# Patient Record
Sex: Female | Born: 1970 | Race: White | Hispanic: No | Smoking: Current every day smoker
Health system: Southern US, Community
[De-identification: ages and names within clinical notes are randomized; demographics above are authoritative.]

## PROBLEM LIST (undated history)

## (undated) DIAGNOSIS — E669 Obesity, unspecified: Secondary | ICD-10-CM

## (undated) DIAGNOSIS — G4733 Obstructive sleep apnea (adult) (pediatric): Secondary | ICD-10-CM

## (undated) DIAGNOSIS — M549 Dorsalgia, unspecified: Secondary | ICD-10-CM

## (undated) DIAGNOSIS — IMO0002 Reserved for concepts with insufficient information to code with codable children: Secondary | ICD-10-CM

## (undated) DIAGNOSIS — E7800 Pure hypercholesterolemia, unspecified: Secondary | ICD-10-CM

## (undated) DIAGNOSIS — C220 Liver cell carcinoma: Secondary | ICD-10-CM

## (undated) DIAGNOSIS — I10 Essential (primary) hypertension: Secondary | ICD-10-CM

## (undated) DIAGNOSIS — M1990 Unspecified osteoarthritis, unspecified site: Secondary | ICD-10-CM

## (undated) DIAGNOSIS — E119 Type 2 diabetes mellitus without complications: Secondary | ICD-10-CM

## (undated) DIAGNOSIS — G8929 Other chronic pain: Secondary | ICD-10-CM

## (undated) HISTORY — PX: TUBAL LIGATION: SHX77

## (undated) HISTORY — PX: CARPAL TUNNEL RELEASE: SHX101

## (undated) HISTORY — PX: CHOLECYSTECTOMY: SHX55

---

## 2001-04-26 ENCOUNTER — Emergency Department (HOSPITAL_COMMUNITY): Admission: EM | Admit: 2001-04-26 | Discharge: 2001-04-26 | Payer: Self-pay | Admitting: Emergency Medicine

## 2004-06-13 ENCOUNTER — Ambulatory Visit (HOSPITAL_COMMUNITY): Admission: RE | Admit: 2004-06-13 | Discharge: 2004-06-13 | Payer: Self-pay | Admitting: Internal Medicine

## 2010-04-22 ENCOUNTER — Encounter (INDEPENDENT_AMBULATORY_CARE_PROVIDER_SITE_OTHER): Payer: Self-pay | Admitting: *Deleted

## 2010-11-21 ENCOUNTER — Ambulatory Visit (HOSPITAL_COMMUNITY): Admission: RE | Admit: 2010-11-21 | Discharge: 2010-11-21 | Payer: Self-pay | Admitting: Family Medicine

## 2011-01-14 ENCOUNTER — Encounter: Payer: Self-pay | Admitting: Family Medicine

## 2011-01-24 NOTE — Letter (Signed)
Summary: New Patient letter  [facility]  [address] [place], [place] [address]   Phone: [phone]  Fax: [phone]       04/22/2010 MRN: [id number]  [facility] [address] [place], [place]  [address]  Dear Ms. Gramajo,  Welcome to the Gastroenterology Division at [facility]    You are scheduled to see Dr. Arlyce Dice on 05/11/2010 at 10:45am on the 3rd floor at [facility], [address]  We ask that you try to arrive at our office 15 minutes prior to your appointment time to allow for check-in.  We would like you to complete the enclosed self-administered evaluation form prior to your visit and bring it with you on the day of your appointment.  We will review it with you.  Also, please bring a complete list of all your medications or, if you prefer, bring the medication bottles and we will list them.  Please bring your insurance card so that we may make a copy of it.  If your insurance requires a referral to see a specialist, please bring your referral form from your primary care physician.  Co-payments are due at the time of your visit and may be paid by cash, check or credit card.     Your office visit will consist of a consult with your physician (includes a physical exam), any laboratory testing he/she may order, scheduling of any necessary diagnostic testing (e.g. x-ray, ultrasound, CT-scan), and scheduling of a procedure (e.g. Endoscopy, Colonoscopy) if required.  Please allow enough time on your schedule to allow for any/all of these possibilities.    If you cannot keep your appointment, please call [phone] to cancel or reschedule prior to your appointment date.  This allows [place] the opportunity to schedule an appointment for another patient in need of care.  If you do not cancel or reschedule by 5 p.m. the business day prior to your appointment date, you will be charged a $50.00 late cancellation/no-show fee.    Thank you for choosing [facility] for your medical needs.  We appreciate the opportunity to care for you.  Please visit [place] at our website  to learn more about our practice.                     Sincerely,                                                             The Gastroenterology Division

## 2012-06-21 IMAGING — US US TRANSVAGINAL NON-OB
1 series · 14 of 25 positions shown · non-contrast
Comparison: CT abdomen and pelvis 06/13/2004.

CLINICAL DATA: Pelvic pain.



[Series 1: us transvaginal non-ob · 0.30mm/px · 14 of 62 slices shown]
[im 1/62]
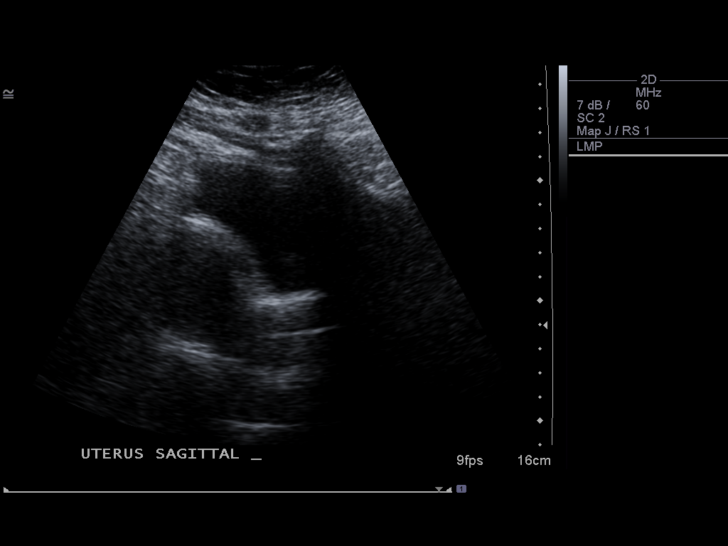
[im 6/62]
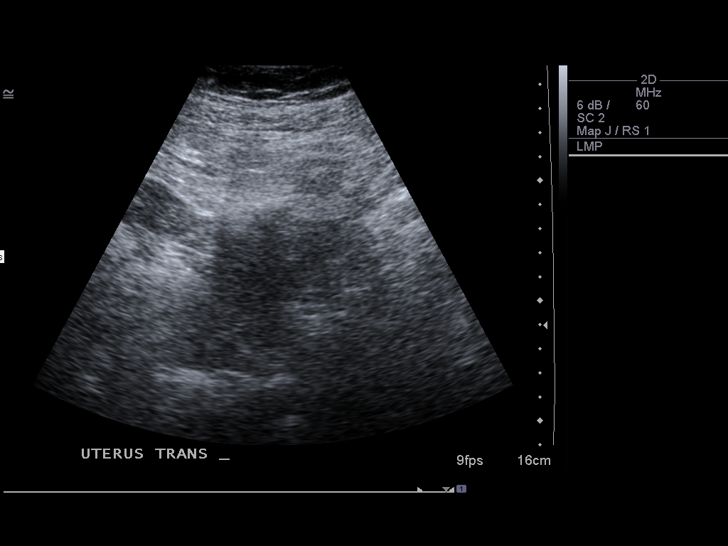
[im 11/62]
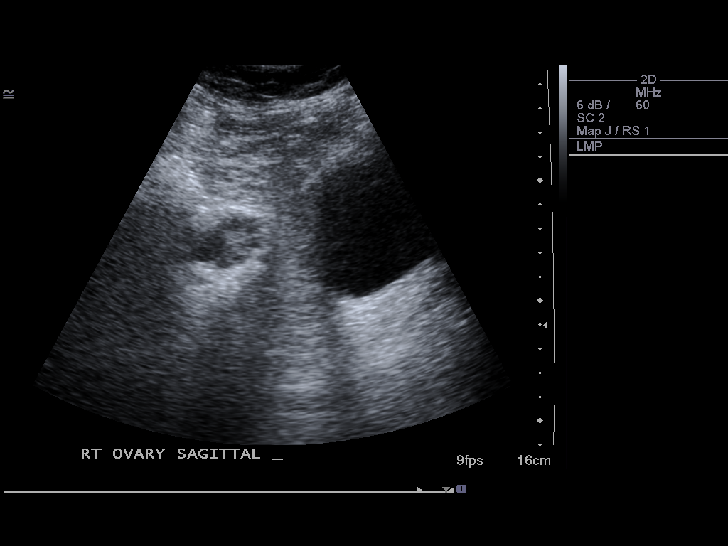
[im 16/62]
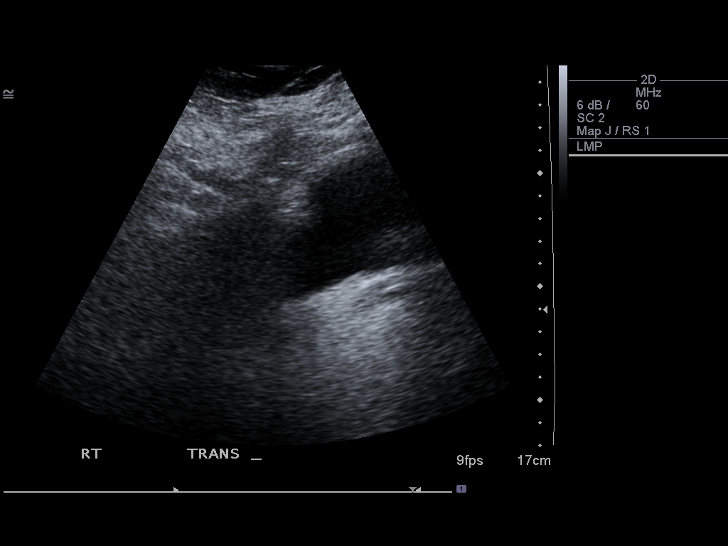
[im 21/62]
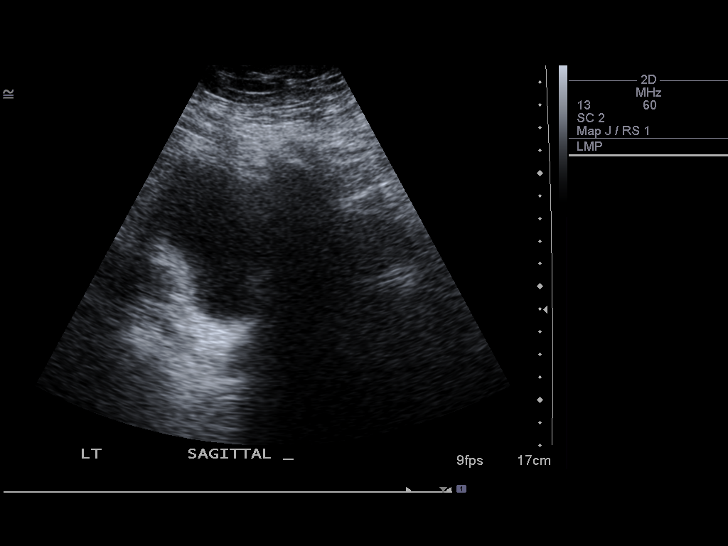
[im 23/62]
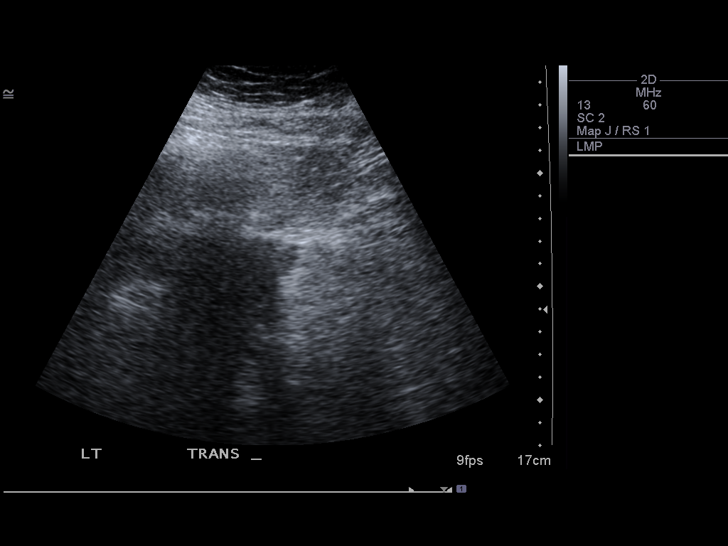
[im 28/62]
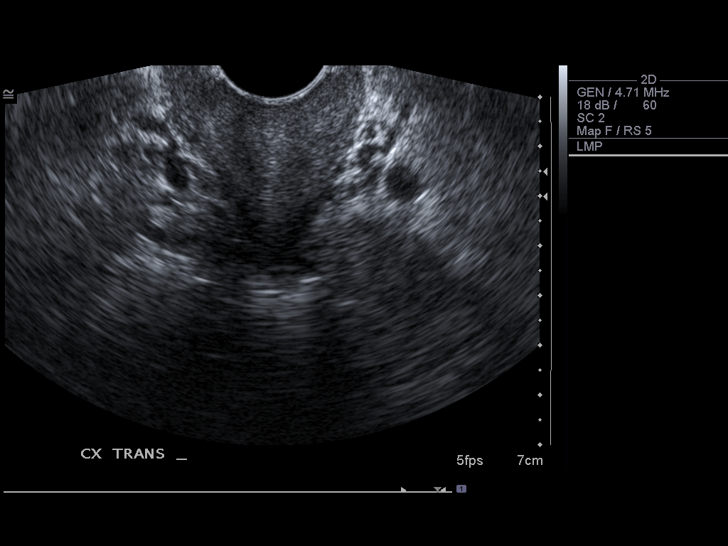
[im 34/62]
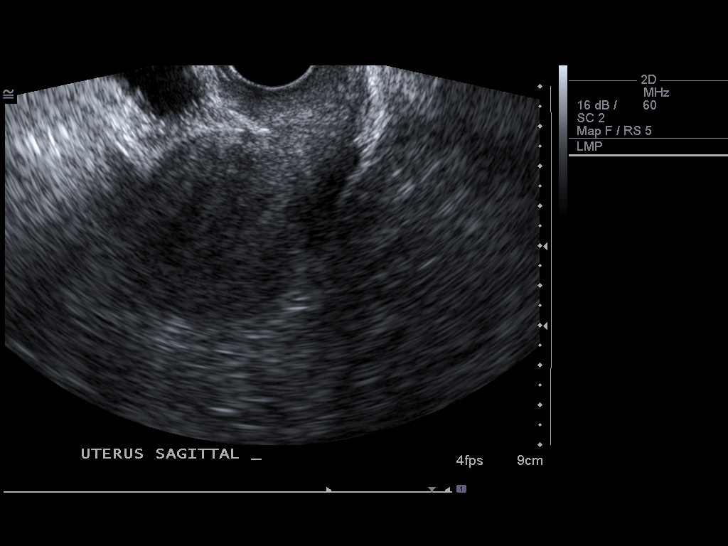
[im 39/62]
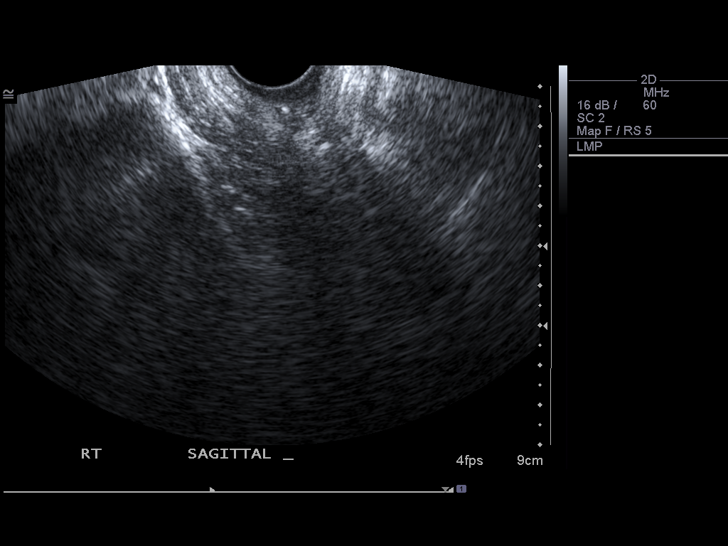
[im 41/62]
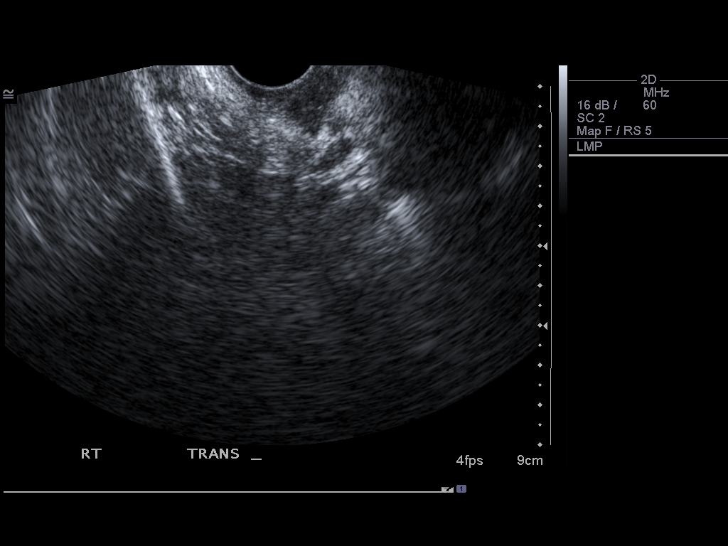
[im 46/62]
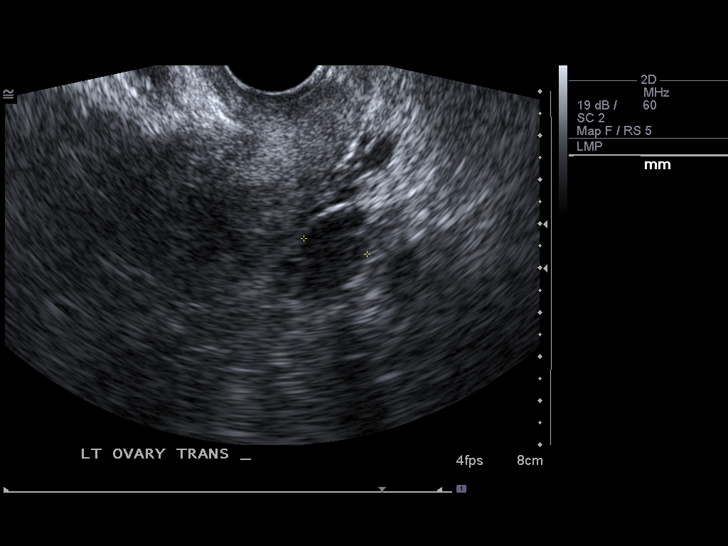
[im 51/62]
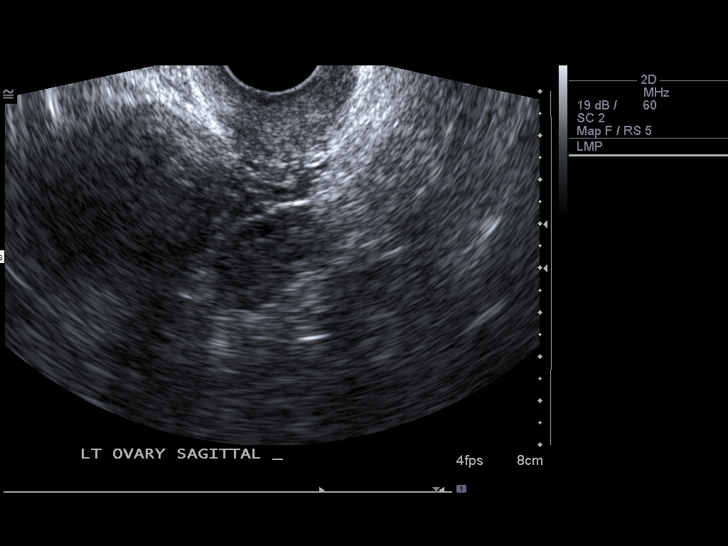
[im 56/62]
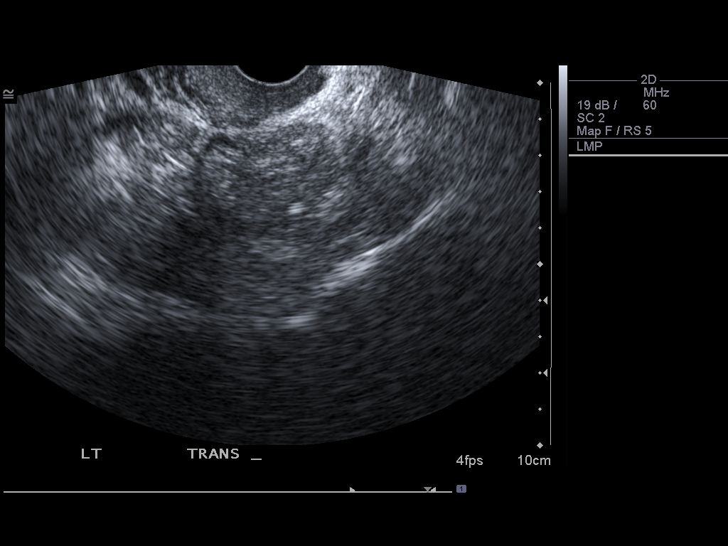
[im 62/62]
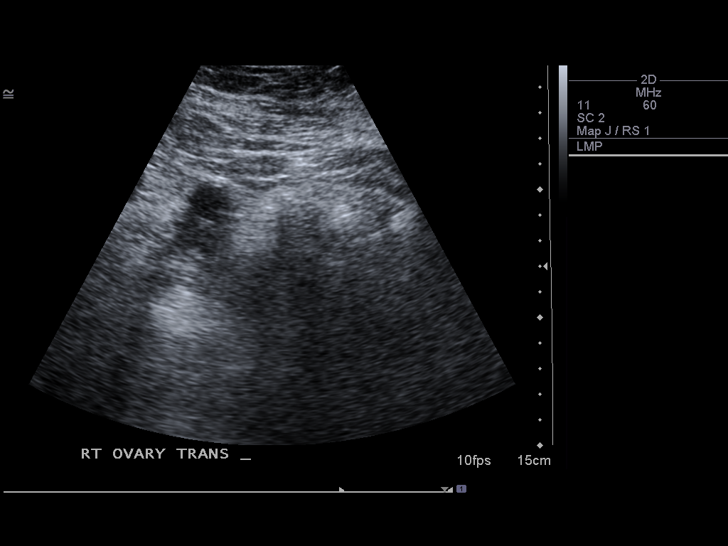

[14 of 25 positions shown; findings below may reference images not displayed]

FINDINGS: Uterus measures 8.5 x 5.1 x 5.1 cm.  It is of normal size and
echotexture.  No focal lesions are evident.

Endometrium measures 1.0 cm, within normal limits.

Right Ovary measures 4.0 x 2.2 x 3.6 cm.  It is of normal size and
echotexture.  A dominant follicle measures 1.4 cm, within normal
limits.

Left Ovary measures 3.2 x 1.7 x 1.5 cm.  It is of normal size and
echotexture.

Other Findings:  No free fluid is present.
IMPRESSION: Negative pelvic ultrasound.

## 2013-08-17 ENCOUNTER — Other Ambulatory Visit: Payer: Self-pay | Admitting: Nurse Practitioner

## 2014-02-07 ENCOUNTER — Emergency Department (HOSPITAL_COMMUNITY)
Admission: EM | Admit: 2014-02-07 | Discharge: 2014-02-07 | Disposition: A | Discharge status: Home / Self Care | Payer: BC Managed Care – PPO | Attending: Emergency Medicine | Admitting: Emergency Medicine

## 2014-02-07 ENCOUNTER — Encounter (HOSPITAL_COMMUNITY): Payer: Self-pay | Admitting: Emergency Medicine

## 2014-02-07 DIAGNOSIS — E7800 Pure hypercholesterolemia, unspecified: Secondary | ICD-10-CM | POA: Insufficient documentation

## 2014-02-07 DIAGNOSIS — R519 Headache, unspecified: Secondary | ICD-10-CM

## 2014-02-07 DIAGNOSIS — Z880 Allergy status to penicillin: Secondary | ICD-10-CM | POA: Insufficient documentation

## 2014-02-07 DIAGNOSIS — I10 Essential (primary) hypertension: Secondary | ICD-10-CM | POA: Insufficient documentation

## 2014-02-07 DIAGNOSIS — IMO0002 Reserved for concepts with insufficient information to code with codable children: Secondary | ICD-10-CM | POA: Insufficient documentation

## 2014-02-07 DIAGNOSIS — Z79899 Other long term (current) drug therapy: Secondary | ICD-10-CM | POA: Insufficient documentation

## 2014-02-07 DIAGNOSIS — F17200 Nicotine dependence, unspecified, uncomplicated: Secondary | ICD-10-CM | POA: Insufficient documentation

## 2014-02-07 DIAGNOSIS — M129 Arthropathy, unspecified: Secondary | ICD-10-CM | POA: Insufficient documentation

## 2014-02-07 DIAGNOSIS — R51 Headache: Secondary | ICD-10-CM | POA: Insufficient documentation

## 2014-02-07 DIAGNOSIS — G8929 Other chronic pain: Secondary | ICD-10-CM | POA: Insufficient documentation

## 2014-02-07 DIAGNOSIS — E119 Type 2 diabetes mellitus without complications: Secondary | ICD-10-CM | POA: Insufficient documentation

## 2014-02-07 DIAGNOSIS — R112 Nausea with vomiting, unspecified: Secondary | ICD-10-CM | POA: Insufficient documentation

## 2014-02-07 HISTORY — DX: Unspecified osteoarthritis, unspecified site: M19.90

## 2014-02-07 HISTORY — DX: Type 2 diabetes mellitus without complications: E11.9

## 2014-02-07 HISTORY — DX: Essential (primary) hypertension: I10

## 2014-02-07 HISTORY — DX: Pure hypercholesterolemia, unspecified: E78.00

## 2014-02-07 HISTORY — DX: Obstructive sleep apnea (adult) (pediatric): G47.33

## 2014-02-07 HISTORY — DX: Dorsalgia, unspecified: M54.9

## 2014-02-07 HISTORY — DX: Other chronic pain: G89.29

## 2014-02-07 HISTORY — DX: Reserved for concepts with insufficient information to code with codable children: IMO0002

## 2014-02-07 MED ORDER — METOCLOPRAMIDE HCL 5 MG/ML IJ SOLN
10.0000 mg | Freq: Once | INTRAMUSCULAR | Status: DC
  Filled 2014-02-07: qty 2

## 2014-02-07 MED ORDER — DIPHENHYDRAMINE HCL 50 MG/ML IJ SOLN
25.0000 mg | Freq: Once | INTRAMUSCULAR | Status: AC
  Administered 2014-02-07: 25 mg via INTRAVENOUS
  Filled 2014-02-07: qty 1

## 2014-02-07 MED ORDER — KETOROLAC TROMETHAMINE 30 MG/ML IJ SOLN
30.0000 mg | Freq: Once | INTRAMUSCULAR | Status: AC
  Administered 2014-02-07: 30 mg via INTRAVENOUS
  Filled 2014-02-07: qty 1

## 2014-02-07 MED ORDER — PSEUDOEPHEDRINE HCL 60 MG PO TABS: 60.0000 mg | ORAL_TABLET | Freq: Three times a day (TID) | ORAL | Status: DC

## 2014-02-07 MED ORDER — DEXAMETHASONE SODIUM PHOSPHATE 10 MG/ML IJ SOLN
10.0000 mg | Freq: Once | INTRAMUSCULAR | Status: AC
  Administered 2014-02-07: 10 mg via INTRAVENOUS
  Filled 2014-02-07: qty 1

## 2014-02-07 MED ORDER — TRIAMCINOLONE ACETONIDE 55 MCG/ACT NA AERO: 2.0000 | INHALATION_SPRAY | Freq: Every day | NASAL | Status: AC

## 2014-02-07 MED ORDER — METOCLOPRAMIDE HCL 5 MG/ML IJ SOLN
10.0000 mg | Freq: Once | INTRAMUSCULAR | Status: AC
  Administered 2014-02-07: 10 mg via INTRAVENOUS

## 2014-02-07 MED ORDER — SODIUM CHLORIDE 0.9 % IV BOLUS (SEPSIS)
1000.0000 mL | Freq: Once | INTRAVENOUS | Status: AC
  Administered 2014-02-07: 1000 mL via INTRAVENOUS

## 2014-02-07 NOTE — ED Notes (Addendum)
C/o 2 week HA, h/o migraines, "feels different", pinpoints pain to L forehead above L eye, "head feels squeezed", also reports nausea, bilateral facial numbness, generally weak and confused. has app[place] on [place]. Has tried excedrin, advil, topamax, muscle relaxers and goodies. 10/10. Alert, NAD, calm, interactive, speech clear, resps e/u, speaking in clear complete sentences. MAEx4.

## 2014-02-07 NOTE — Discharge Instructions (Signed)
As discussed, it is important that you follow up as soon as possible with your physician for continued management of your condition.  If you headache persists beyond the next few days, please be sure to follow up with our ENT specialist as well.  If you develop any new, or concerning changes in your condition, please return to the emergency department immediately.

## 2014-02-07 NOTE — ED Provider Notes (Signed)
CSN: [id number]     Arrival date & time 02/07/14  2030 History   First MD Initiated Contact with Patient 02/07/14 2106     Chief Complaint  Patient presents with  . Headache     (Consider location/radiation/quality/duration/timing/severity/associated sxs/prior Treatment) HPI Patient presents with headache.  This headache has been present for almost 2 weeks, his left facial, left temporal, nonradiating.  It is sore, severe, not improved with all medications, including Topamax, OTC medication. There is no new visual loss, though there is photophobia. Patient has a long history of migraines, though today's presentation is somewhat atypical. There is no new neck pain, stiffness, fever. There is associated nausea and vomiting, this is not unusual for the patient with headaches. No no unilateral weakness, chest pain, dyspnea, abdominal pain.  Past Medical History  Diagnosis Date  . Back pain, chronic   . HTN (hypertension)   . Diabetes mellitus without complication   . OSA (obstructive sleep apnea)   . Hypercholesterolemia   . Arthritis   . DDD (degenerative disc disease)    Past Surgical History  Procedure Laterality Date  . Cholecystectomy    . Tubal ligation    . Carpal tunnel release     No family history on file. History  Substance Use Topics  . Smoking status: Current Every Day Smoker  . Smokeless tobacco: Not on file  . Alcohol Use: No   OB History   Grav Para Term Preterm Abortions TAB SAB Ect Mult Living                 Review of Systems  Constitutional:       Per HPI, otherwise negative  HENT:       Per HPI, otherwise negative  Respiratory:       Per HPI, otherwise negative  Cardiovascular:       Per HPI, otherwise negative  Gastrointestinal: Positive for nausea and vomiting. Negative for diarrhea.  Endocrine:       Negative aside from HPI  Genitourinary:       Neg aside from HPI   Musculoskeletal:       Per HPI, otherwise negative  Skin: Negative.    Neurological: Positive for headaches. Negative for syncope.      Allergies  Penicillins  Home Medications   Current Outpatient Rx  Name  Route  Sig  Dispense  Refill  . aspirin-acetaminophen-caffeine (EXCEDRIN MIGRAINE) 250-250-65 MG per tablet   Oral   Take 4 tablets by mouth every 8 (eight) hours as needed for headache.         . esomeprazole (NEXIUM) 40 MG capsule   Oral   Take 40 mg by mouth daily at 12 noon.         . fenofibrate 160 MG tablet   Oral   Take 160 mg by mouth daily.         Marland Kitchen. HYDROcodone-acetaminophen (NORCO/VICODIN) 5-325 MG per tablet   Oral   Take 1 tablet by mouth every 6 (six) hours as needed for moderate pain.         Marland Kitchen. lisinopril-hydrochlorothiazide (PRINZIDE,ZESTORETIC) 20-25 MG per tablet   Oral   Take 1 tablet by mouth daily.         . potassium chloride (K-DUR,KLOR-CON) 10 MEQ tablet   Oral   Take 10 mEq by mouth daily.         . simvastatin (ZOCOR) 40 MG tablet   Oral   Take 40 mg by mouth daily.         .Marland Kitchen  sitaGLIPtin (JANUVIA) 100 MG tablet   Oral   Take 100 mg by mouth daily.         Marland Kitchen tiZANidine (ZANAFLEX) 4 MG tablet   Oral   Take 4 mg by mouth every 6 (six) hours as needed for muscle spasms.          BP 113/64  Pulse 80  Temp(Src) 97.6 F (36.4 C) (Oral)  Resp 20  Wt 235 lb 7 oz (106.794 kg)  SpO2 97%  LMP 01/24/2014 Physical Exam  Nursing note and vitals reviewed. Constitutional: She is oriented to person, place, and time. She appears well-developed and well-nourished. No distress.  HENT:  Head: Normocephalic and atraumatic.  Head is atraumatic, there is no gross deformity.  There is tenderness to palpation about the frontal sinuses, maxillary sinuses bilaterally, with no rhinorrhea, no discharge, no intranasal findings or posterior oral pharyngeal findings.   Eyes: Conjunctivae and EOM are normal.  Cardiovascular: Normal rate and regular rhythm.   Pulmonary/Chest: Effort normal and breath  sounds normal. No stridor. No respiratory distress.  Abdominal: She exhibits no distension.  Musculoskeletal: She exhibits no edema.  Neurological: She is alert and oriented to person, place, and time. No cranial nerve deficit. She exhibits normal muscle tone. Coordination normal.  Skin: Skin is warm and dry.  Psychiatric: She has a normal mood and affect.    ED Course  Procedures (including critical care time)   EKG Interpretation    Date/Time:  Saturday February 07 2014 20:39:09 EST Ventricular Rate:  71 PR Interval:  174 QRS Duration: 86 QT Interval:  398 QTC Calculation: 432 R Axis:   31 Text Interpretation:  Normal sinus rhythm Normal ECG Sinus rhythm Normal ECG Confirmed by Gerhard Munch  MD ([id number]) on 02/07/2014 9:28:29 PM           10:52 PM Headache has resolved MDM    This patient with a long history of headaches presents with ongoing headache.  She does describe this is somewhat atypical, the patient however, has had gradual progression, has no neurologic deficits, he is hemodynamically stable, and improved here.  With low suspicion for occult new systemic pathology, she was discharged in a stable condition.    Gerhard Munch, MD 02/07/14 2252

## 2017-10-31 ENCOUNTER — Emergency Department (HOSPITAL_COMMUNITY)
Admission: EM | Admit: 2017-10-31 | Discharge: 2017-10-31 | Disposition: A | Discharge status: Home / Self Care | Payer: No Typology Code available for payment source | Attending: Emergency Medicine | Admitting: Emergency Medicine

## 2017-10-31 ENCOUNTER — Other Ambulatory Visit: Payer: Self-pay

## 2017-10-31 ENCOUNTER — Emergency Department (HOSPITAL_BASED_OUTPATIENT_CLINIC_OR_DEPARTMENT_OTHER)
Admit: 2017-10-31 | Discharge: 2017-10-31 | Disposition: A | Discharge status: Home / Self Care | Payer: No Typology Code available for payment source

## 2017-10-31 ENCOUNTER — Encounter (HOSPITAL_COMMUNITY): Payer: Self-pay | Admitting: *Deleted

## 2017-10-31 ENCOUNTER — Emergency Department (HOSPITAL_COMMUNITY): Payer: No Typology Code available for payment source

## 2017-10-31 DIAGNOSIS — Z880 Allergy status to penicillin: Secondary | ICD-10-CM | POA: Diagnosis not present

## 2017-10-31 DIAGNOSIS — F17210 Nicotine dependence, cigarettes, uncomplicated: Secondary | ICD-10-CM | POA: Diagnosis not present

## 2017-10-31 DIAGNOSIS — Z7984 Long term (current) use of oral hypoglycemic drugs: Secondary | ICD-10-CM | POA: Insufficient documentation

## 2017-10-31 DIAGNOSIS — M79609 Pain in unspecified limb: Secondary | ICD-10-CM

## 2017-10-31 DIAGNOSIS — M7989 Other specified soft tissue disorders: Secondary | ICD-10-CM | POA: Diagnosis not present

## 2017-10-31 DIAGNOSIS — R2241 Localized swelling, mass and lump, right lower limb: Secondary | ICD-10-CM | POA: Insufficient documentation

## 2017-10-31 DIAGNOSIS — I10 Essential (primary) hypertension: Secondary | ICD-10-CM | POA: Insufficient documentation

## 2017-10-31 DIAGNOSIS — Z79899 Other long term (current) drug therapy: Secondary | ICD-10-CM | POA: Insufficient documentation

## 2017-10-31 DIAGNOSIS — R2242 Localized swelling, mass and lump, left lower limb: Secondary | ICD-10-CM | POA: Insufficient documentation

## 2017-10-31 DIAGNOSIS — G4733 Obstructive sleep apnea (adult) (pediatric): Secondary | ICD-10-CM | POA: Diagnosis not present

## 2017-10-31 DIAGNOSIS — Z79810 Long term (current) use of selective estrogen receptor modulators (SERMs): Secondary | ICD-10-CM | POA: Diagnosis not present

## 2017-10-31 DIAGNOSIS — R600 Localized edema: Secondary | ICD-10-CM

## 2017-10-31 DIAGNOSIS — Z91040 Latex allergy status: Secondary | ICD-10-CM | POA: Insufficient documentation

## 2017-10-31 DIAGNOSIS — E119 Type 2 diabetes mellitus without complications: Secondary | ICD-10-CM | POA: Insufficient documentation

## 2017-10-31 HISTORY — DX: Obesity, unspecified: E66.9

## 2017-10-31 LAB — CBC WITH DIFFERENTIAL/PLATELET
Basophils Absolute: 0 10*3/uL (ref 0.0–0.1)
Basophils Relative: 1 %
EOS ABS: 0.2 10*3/uL (ref 0.0–0.7)
Eosinophils Relative: 2 %
HEMATOCRIT: 40 % (ref 36.0–46.0)
HEMOGLOBIN: 14 g/dL (ref 12.0–15.0)
LYMPHS ABS: 2.5 10*3/uL (ref 0.7–4.0)
LYMPHS PCT: 36 %
MCH: 31.8 pg (ref 26.0–34.0)
MCHC: 35 g/dL (ref 30.0–36.0)
MCV: 90.9 fL (ref 78.0–100.0)
MONOS PCT: 11 %
Monocytes Absolute: 0.8 10*3/uL (ref 0.1–1.0)
NEUTROS ABS: 3.5 10*3/uL (ref 1.7–7.7)
NEUTROS PCT: 50 %
Platelets: 158 10*3/uL (ref 150–400)
RBC: 4.4 MIL/uL (ref 3.87–5.11)
RDW: 13.5 % (ref 11.5–15.5)
WBC: 6.9 10*3/uL (ref 4.0–10.5)

## 2017-10-31 LAB — COMPREHENSIVE METABOLIC PANEL
ALK PHOS: 52 U/L (ref 38–126)
ALT: 25 U/L (ref 14–54)
ANION GAP: 10 (ref 5–15)
AST: 30 U/L (ref 15–41)
Albumin: 3.8 g/dL (ref 3.5–5.0)
BUN: 9 mg/dL (ref 6–20)
CALCIUM: 8.9 mg/dL (ref 8.9–10.3)
CHLORIDE: 104 mmol/L (ref 101–111)
CO2: 23 mmol/L (ref 22–32)
Creatinine, Ser: 0.47 mg/dL (ref 0.44–1.00)
GFR calc non Af Amer: >60 mL/min (ref >60)
Glucose, Bld: 251 mg/dL — ABNORMAL HIGH (ref 65–99)
POTASSIUM: 3.8 mmol/L (ref 3.5–5.1)
SODIUM: 137 mmol/L (ref 135–145)
Total Bilirubin: 0.8 mg/dL (ref 0.3–1.2)
Total Protein: 6.3 g/dL — ABNORMAL LOW (ref 6.5–8.1)

## 2017-10-31 LAB — URINALYSIS, ROUTINE W REFLEX MICROSCOPIC
Bilirubin Urine: NEGATIVE
GLUCOSE, UA: 150 mg/dL — AB
Hgb urine dipstick: NEGATIVE
Ketones, ur: 5 mg/dL — AB
Nitrite: NEGATIVE
PH: 5 (ref 5.0–8.0)
Protein, ur: NEGATIVE mg/dL
SPECIFIC GRAVITY, URINE: 1.028 (ref 1.005–1.030)

## 2017-10-31 LAB — I-STAT BETA HCG BLOOD, ED (MC, WL, AP ONLY)

## 2017-10-31 LAB — BRAIN NATRIURETIC PEPTIDE: B Natriuretic Peptide: 13.7 pg/mL (ref 0.0–100.0)

## 2017-10-31 LAB — I-STAT TROPONIN, ED: Troponin i, poc: 0 ng/mL (ref 0.00–0.08)

## 2017-10-31 LAB — LIPASE, BLOOD: Lipase: 47 U/L (ref 11–51)

## 2017-10-31 LAB — D-DIMER, QUANTITATIVE (NOT AT ARMC)

## 2017-10-31 NOTE — ED Triage Notes (Signed)
Pt reports bilateral lower leg swelling x 3-4 months. More severe in left leg and states she has an "indention" to back of her left calf. Also has persistent cough for months. No acute distress is noted at triage.

## 2017-10-31 NOTE — Discharge Instructions (Signed)
Your lab results and imaging showed no acute abnormalities.  Please follow-up with the vascular specialist and your primary care provider on this matter.

## 2017-10-31 NOTE — Progress Notes (Signed)
Preliminary results by tech - Venous Duplex Lower Ext. Completed. Negative for deep and superficial vein thrombosis.  , BS, RDMS, RVT  

## 2017-10-31 NOTE — ED Notes (Signed)
Pt has had edema bilateral legs for "months" has seen private MD -- also c/o shortness of breath with exertion and cough.

## 2017-10-31 NOTE — ED Provider Notes (Signed)
[facility] EMERGENCY DEPARTMENT Provider Note   CSN: [id number] Arrival date & time: 10/31/17  0749     History   Chief Complaint Chief Complaint  Patient presents with  . Leg Swelling    HPI Patricia Parker is a 46 y.o. female.  HPI   Patricia Parker is a 46 y.o. female, with a history of DM, HTN, and obesity, presenting to the ED with bilateral lower extremity swelling for the past 3-4 months. Swelling is persistent, started first on the left, now worse on the left. Accompanied by dull, aching pain persistent in the bilateral calves, starting first on the left and worst on the left, rates it 5-6/10. Symptoms do not improve with rest or elevation.  Occasional sharp pains radiating from the knee distally bilaterally; happens with walking or at rest. Occasional toe cramping and transient paresthesias.  Increasing shortness of breath accompanied by dry cough for the last month, "It feels like every once in a while I have to catch my breath with deep breathing." Also notes decreased urinary, anorexia, and upper abdominal bloating and discomfort for the past month. Occasional chest heaviness, rest or with exertion, central chest, radiates to left chest, lasts for about 15 minutes, for the last 2-3 months. Does endorse orthopnea, but states that has been going on since 2009. LMP 10/24/17. Denies fever/chills, N/V, acute diarrhea, dysuria, hematuria, hematochezia/melena, or any other complaints.  Denies alcohol or illicit drug use. Smokes 0.5 PPD. Denies history of DVT/PE, long distance travel, recent surgery, recent trauma, or hormonal therapy.   Past Medical History:  Diagnosis Date  . Arthritis   . Back pain, chronic   . DDD (degenerative disc disease)   . Diabetes mellitus without complication ([place])   . HTN (hypertension)   . Hypercholesterolemia   . Obesity   . OSA (obstructive sleep apnea)     There are no active problems to display for this patient.   Past  Surgical History:  Procedure Laterality Date  . CARPAL TUNNEL RELEASE    . CHOLECYSTECTOMY    . TUBAL LIGATION      OB History    No data available       Home Medications    Prior to Admission medications   Medication Sig Start Date End Date Taking? Authorizing Provider  Blood Glucose Monitoring Suppl (ONE TOUCH ULTRA 2) w/Device KIT 1 each daily as needed. 12/16/14  Yes [provider]  cyclobenzaprine (FLEXERIL) 10 MG tablet Take 10 mg 3 (three) times daily as needed by mouth for muscle spasms.   Yes [provider]  esomeprazole (NEXIUM) 40 MG capsule Take 40 mg by mouth daily at 12 noon.   Yes [provider]  glimepiride (AMARYL) 4 MG tablet Take 8 mg daily before breakfast by mouth. 08/24/17  Yes [provider]  Glucose Blood (BLOOD GLUCOSE TEST STRIPS) STRP 1 each daily as needed. 11/07/13  Yes [provider]  ibuprofen (ADVIL,MOTRIN) 800 MG tablet Take 800 mg every 8 (eight) hours as needed by mouth. 10/29/17  Yes [provider]  lisinopril-hydrochlorothiazide (PRINZIDE,ZESTORETIC) 20-25 MG per tablet Take 1 tablet by mouth daily.   Yes [provider]  metFORMIN (GLUCOPHAGE) 500 MG tablet Take 500 mg 2 (two) times daily by mouth. 10/25/17  Yes [provider]  Jonetta Speak LANCETS 11B MISC 1 each daily as needed. 12/16/14  Yes [provider]  potassium chloride (K-DUR,KLOR-CON) 10 MEQ tablet Take 20 mEq daily by mouth.  Yes [provider]  pregabalin (LYRICA) 25 MG capsule Take 25 mg 2 (two) times daily by mouth.   Yes [provider]  promethazine (PHENERGAN) 25 MG tablet Take 25 mg every 6 (six) hours as needed by mouth for nausea.  08/30/17  Yes [provider]  rizatriptan (MAXALT-MLT) 10 MG disintegrating tablet Take 10 mg daily as needed by mouth. 10/18/17  Yes [provider]  simvastatin (ZOCOR) 40 MG tablet Take 40 mg by mouth daily.   Yes [provider]  sitaGLIPtin (JANUVIA) 100 MG tablet Take 100 mg by mouth daily.   Yes [provider]  aspirin-acetaminophen-caffeine (EXCEDRIN MIGRAINE) [phone] MG per tablet Take 4 tablets by mouth every 8 (eight) hours as needed for headache.    [provider]  fenofibrate 160 MG tablet Take 160 mg by mouth daily.    [provider]  HYDROcodone-acetaminophen (NORCO/VICODIN) 5-325 MG per tablet Take 1 tablet by mouth every 6 (six) hours as needed for moderate pain.    [provider]  pseudoephedrine (SUDAFED) 60 MG tablet Take 1 tablet (60 mg total) by mouth every 8 (eight) hours. For four days 02/07/14   Carmin Muskrat, MD  tiZANidine (ZANAFLEX) 4 MG tablet Take 4 mg by mouth every 6 (six) hours as needed for muscle spasms.    [provider]  triamcinolone (NASACORT AQ) 55 MCG/ACT AERO nasal inhaler Place 2 sprays into the nose daily. For four days 02/07/14 02/11/14  Carmin Muskrat, MD    Family History History reviewed. No pertinent family history.  Social History Social History   Tobacco Use  . Smoking status: Current Every Day Smoker  Substance Use Topics  . Alcohol use: No  . Drug use: No     Allergies   Actos [pioglitazone]; Penicillins; and Latex   Review of Systems Review of Systems  Constitutional: Negative for chills and fever.  Respiratory: Positive for cough and shortness of breath.   Cardiovascular: Positive for chest pain (intermittent) and leg swelling.  Gastrointestinal: Positive for abdominal distention and nausea. Negative for blood in stool and vomiting.  Genitourinary: Positive for decreased urine volume. Negative for dysuria, hematuria, vaginal bleeding and vaginal discharge.  Musculoskeletal: Positive for myalgias.  Neurological: Negative for weakness and numbness.  All other systems reviewed and are negative.    Physical Exam Updated Vital Signs BP (!) 147/85 (BP Location: Right Arm)   Pulse 78    Temp 98.3 F (36.8 C) (Oral)   Resp 15   LMP 10/24/2017   SpO2 100%   Physical Exam  Constitutional: She appears well-developed and well-nourished. No distress.  HENT:  Head: Normocephalic and atraumatic.  Eyes: Conjunctivae are normal.  Neck: Neck supple.  Cardiovascular: Normal rate, regular rhythm, normal heart sounds and intact distal pulses.  Pulses:      Dorsalis pedis pulses are 2+ on the right side, and 2+ on the left side.       Posterior tibial pulses are 2+ on the right side, and 2+ on the left side.  Pulmonary/Chest: Effort normal and breath sounds normal. No respiratory distress.  Abdominal: Soft. There is no tenderness. There is no guarding.  Obese abdomen  Musculoskeletal: She exhibits edema and tenderness.  Swelling noted to bilateral calves and feet, left worse than right. Significant tenderness to bilateral calves and feet. Lower extremities appropriately warm to touch.  Full range of motion in the bilateral hips, knees, and ankles.  Patient ambulatory without assistance.  Lymphadenopathy:  She has no cervical adenopathy.  Neurological: She is alert.  No noted acute sensory deficits. Strength 5/5 with flexion and extension at the bilateral hips, knees, and ankles. No noted gait deficit.  Skin: Skin is warm and dry. Capillary refill takes less than 2 seconds. She is not diaphoretic.  Psychiatric: She has a normal mood and affect. Her behavior is normal.  Nursing note and vitals reviewed.    ED Treatments / Results  Labs (all labs ordered are listed, but only abnormal results are displayed) Labs Reviewed  COMPREHENSIVE METABOLIC PANEL - Abnormal; Notable for the following components:      Result Value   Glucose, Bld 251 (*)    Total Protein 6.3 (*)    All other components within normal limits  URINALYSIS, ROUTINE W REFLEX MICROSCOPIC - Abnormal; Notable for the following components:   APPearance HAZY (*)    Glucose, UA 150 (*)    Ketones, ur 5 (*)     Leukocytes, UA TRACE (*)    Bacteria, UA RARE (*)    Squamous Epithelial / LPF 6-30 (*)    All other components within normal limits  LIPASE, BLOOD  CBC WITH DIFFERENTIAL/PLATELET  D-DIMER, QUANTITATIVE (NOT AT [facility])  BRAIN NATRIURETIC PEPTIDE  I-STAT BETA HCG BLOOD, ED ([facility], [facility], [facility] ONLY)  I-STAT TROPONIN, ED    EKG  EKG Interpretation None       Radiology Dg Chest 2 View  Result Date: 10/31/2017 CLINICAL DATA:  Shortness of breath, chest pain for several days, bilateral lower extremity swelling for 3-4 months. EXAM: CHEST  2 VIEW COMPARISON:  None. FINDINGS: The heart size and mediastinal contours are within normal limits. Both lungs are clear. The visualized skeletal structures are unremarkable. IMPRESSION: No active cardiopulmonary disease. No evidence of pneumonia or pulmonary edema. Electronically Signed   By: Franki Cabot M.D.   On: 10/31/2017 12:41    Procedures Procedures (including critical care time)  Medications Ordered in ED Medications - No data to display   Initial Impression / Assessment and Plan / ED Course  I have reviewed the triage vital signs and the nursing notes.  Pertinent labs & imaging results that were available during my care of the patient were reviewed by me and considered in my medical decision making (see chart for details).     Patient presents with lower extremity swelling and pain.  No DVT noted on duplex ultrasound.  Lab results are reassuring. Pain due to the patient's statin was considered, however, thought less likely since she has not been taking this medication for the last month. Venous insufficiency is a consideration.  PCP and vascular follow-up. Patient able to ambulate without noted difficulty, need for assistance, and without onset of shortness of breath, chest pain, or other complaints. The patient was given instructions for home care as well as return precautions. Patient voices understanding of these instructions, accepts the  plan, and is comfortable with discharge.  Vitals:   10/31/17 1130 10/31/17 1145 10/31/17 1200 10/31/17 1310  BP: 111/61 118/66  118/84  Pulse: 63 78 77 78  Resp: '16 15 17 18  '$ Temp:      TempSrc:      SpO2: 97% 97% 95% 98%     Final Clinical Impressions(s) / ED Diagnoses   Final diagnoses:  Bilateral lower extremity edema    ED Discharge Orders    None       Layla Maw 10/31/17 1503    Quintella Reichert, MD  11/02/17 1014  

## 2017-10-31 NOTE — ED Notes (Signed)
Returned from vascular

## 2019-06-01 IMAGING — DX DG CHEST 2V
2 series · 2 of 2 positions shown · non-contrast
Comparison: None.

CLINICAL DATA: Shortness of breath, chest pain for several days,
bilateral lower extremity swelling for 3-4 months.

EXAM:
CHEST  2 VIEW

[w chest lat]
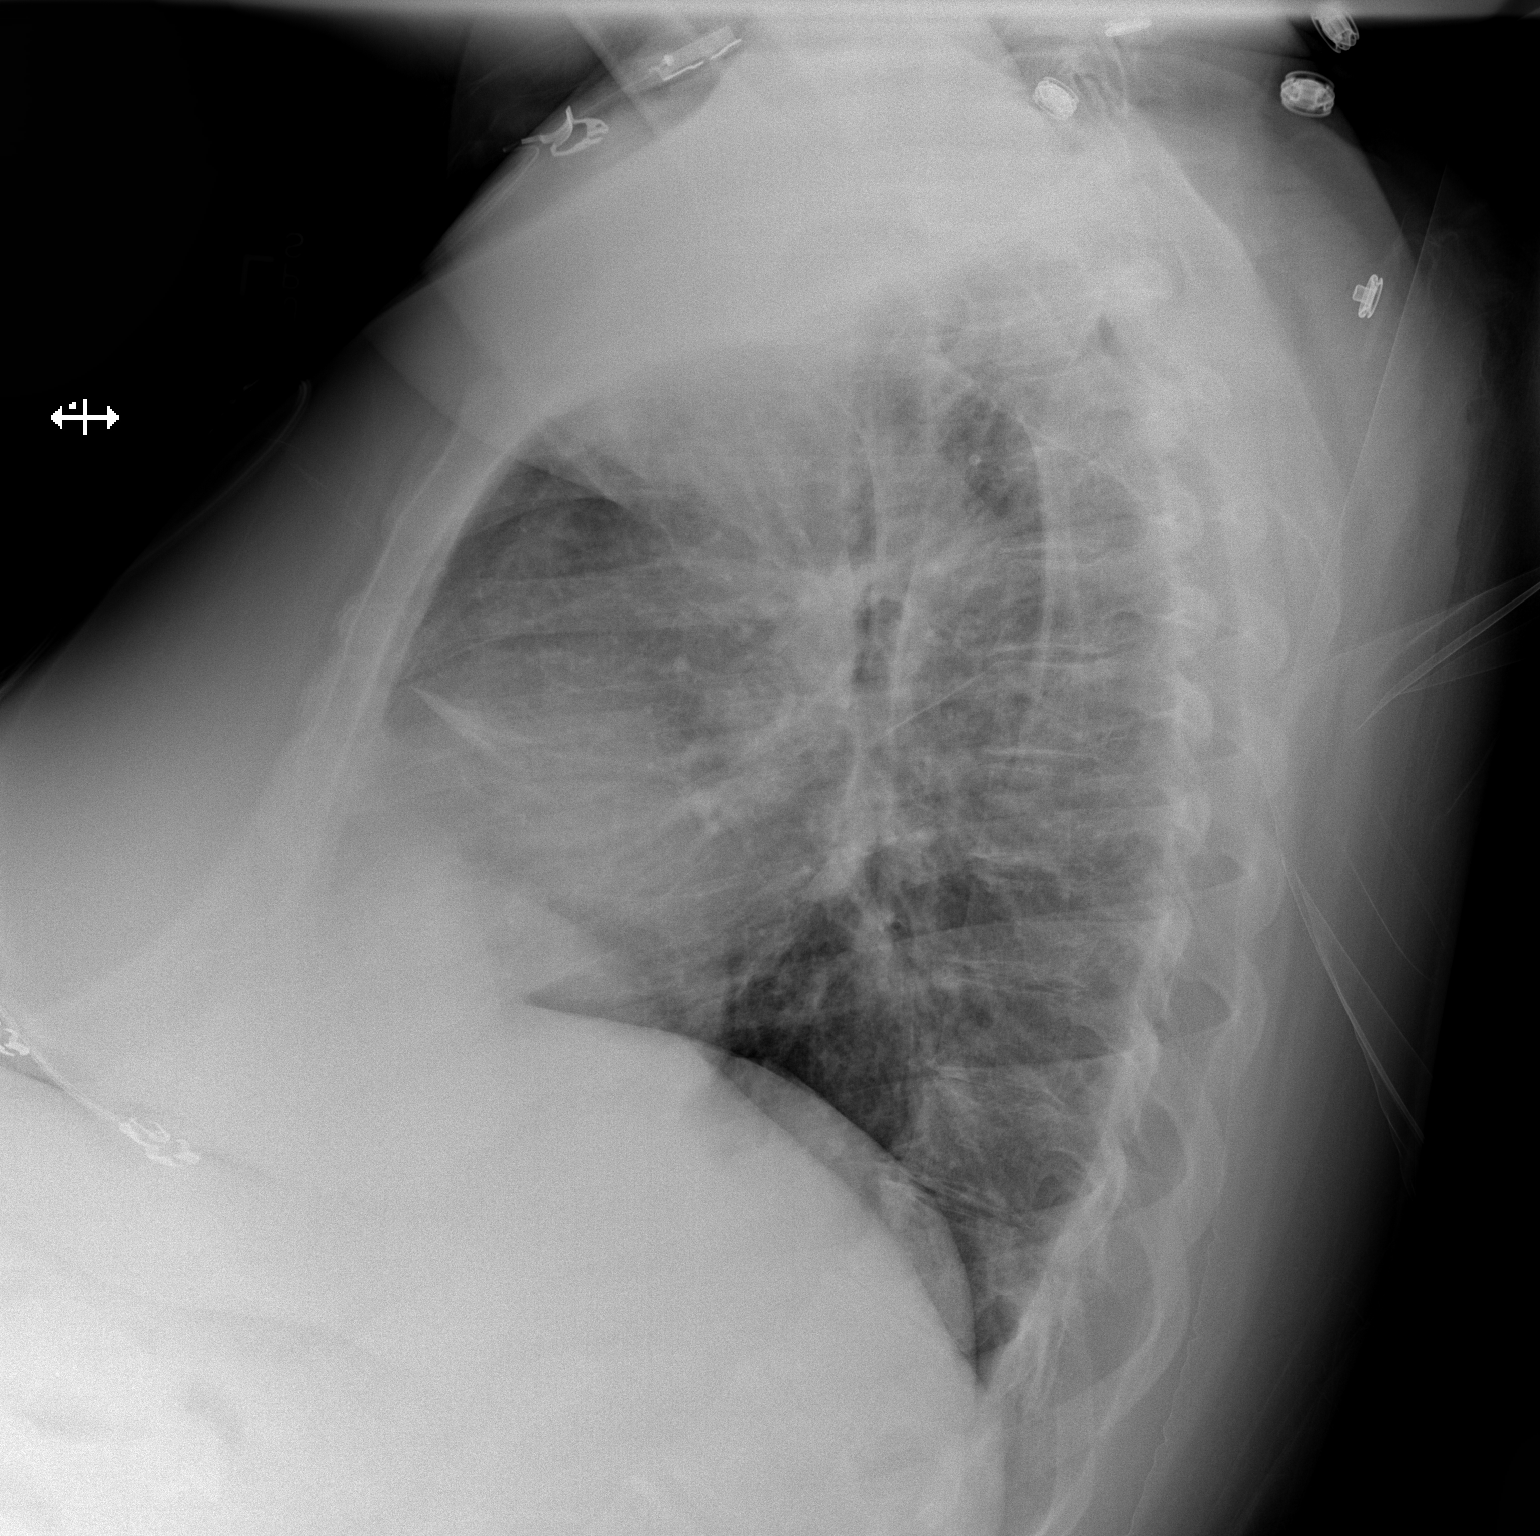

[w chest pa]
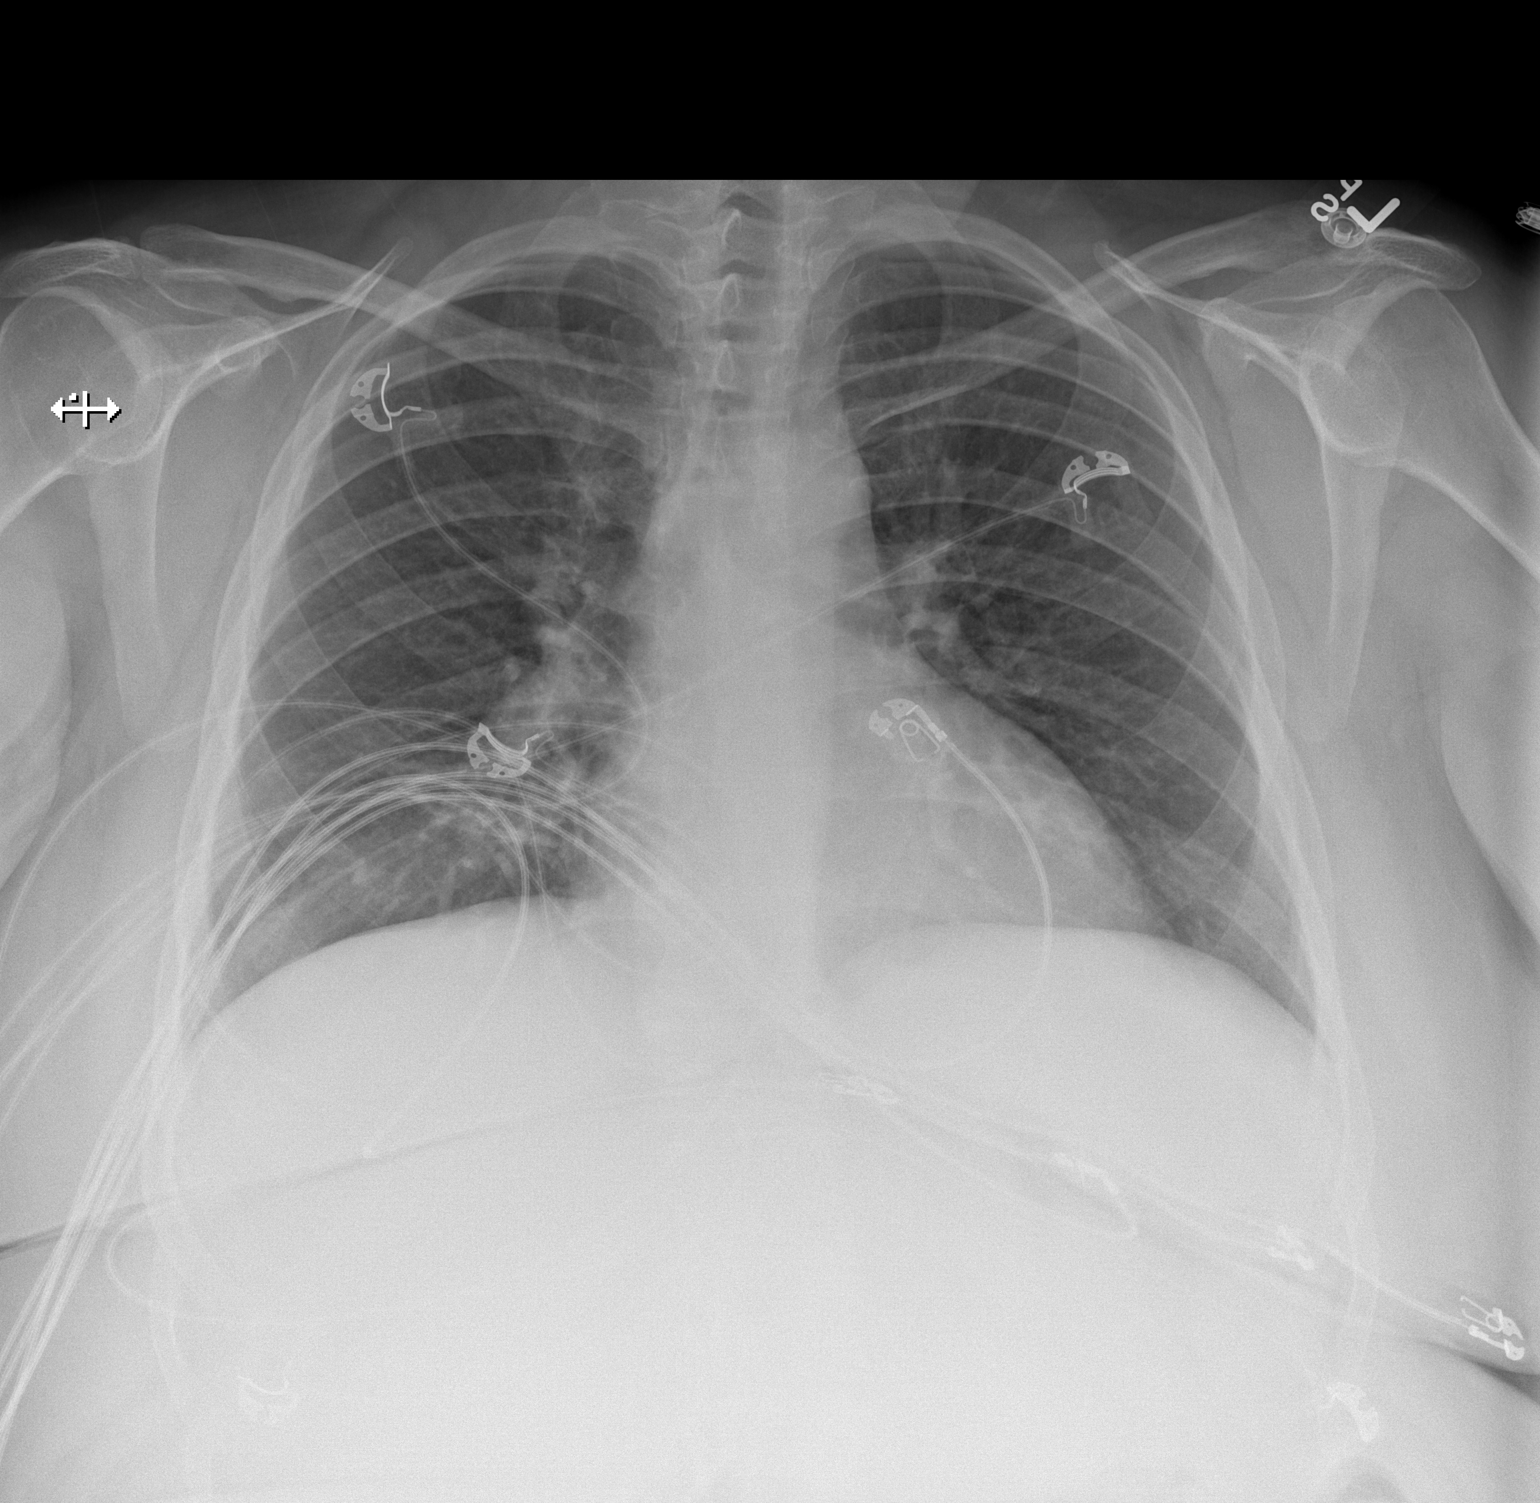

[2 of 2 positions shown; findings below may reference images not displayed]

FINDINGS: The heart size and mediastinal contours are within normal limits.
Both lungs are clear. The visualized skeletal structures are
unremarkable.
IMPRESSION: No active cardiopulmonary disease. No evidence of pneumonia or
pulmonary edema.

## 2021-11-22 ENCOUNTER — Other Ambulatory Visit: Payer: Self-pay | Admitting: Pain Medicine

## 2021-11-22 DIAGNOSIS — M79602 Pain in left arm: Secondary | ICD-10-CM

## 2021-11-22 DIAGNOSIS — M542 Cervicalgia: Secondary | ICD-10-CM

## 2021-11-22 DIAGNOSIS — R200 Anesthesia of skin: Secondary | ICD-10-CM

## 2021-11-22 DIAGNOSIS — R202 Paresthesia of skin: Secondary | ICD-10-CM

## 2022-07-09 ENCOUNTER — Encounter (HOSPITAL_BASED_OUTPATIENT_CLINIC_OR_DEPARTMENT_OTHER): Payer: Self-pay | Admitting: Emergency Medicine

## 2022-07-09 ENCOUNTER — Other Ambulatory Visit: Payer: Self-pay

## 2022-07-09 DIAGNOSIS — D72829 Elevated white blood cell count, unspecified: Secondary | ICD-10-CM | POA: Insufficient documentation

## 2022-07-09 DIAGNOSIS — Z91040 Latex allergy status: Secondary | ICD-10-CM | POA: Insufficient documentation

## 2022-07-09 DIAGNOSIS — R1012 Left upper quadrant pain: Secondary | ICD-10-CM | POA: Diagnosis present

## 2022-07-09 NOTE — ED Triage Notes (Addendum)
Pt c/o LLQ abd pain starting 1900 today, worse with inspiration. Denies urinary symptoms.

## 2022-07-10 ENCOUNTER — Emergency Department (HOSPITAL_BASED_OUTPATIENT_CLINIC_OR_DEPARTMENT_OTHER): Payer: Medicaid Other

## 2022-07-10 ENCOUNTER — Encounter (HOSPITAL_BASED_OUTPATIENT_CLINIC_OR_DEPARTMENT_OTHER): Payer: Self-pay

## 2022-07-10 ENCOUNTER — Emergency Department (HOSPITAL_BASED_OUTPATIENT_CLINIC_OR_DEPARTMENT_OTHER)
Admission: EM | Admit: 2022-07-10 | Discharge: 2022-07-10 | Disposition: A | Discharge status: Home / Self Care | Payer: Medicaid Other | Attending: Emergency Medicine | Admitting: Emergency Medicine

## 2022-07-10 DIAGNOSIS — R160 Hepatomegaly, not elsewhere classified: Secondary | ICD-10-CM

## 2022-07-10 DIAGNOSIS — R1013 Epigastric pain: Secondary | ICD-10-CM

## 2022-07-10 LAB — CBC
HCT: 46.4 % — ABNORMAL HIGH (ref 36.0–46.0)
Hemoglobin: 16 g/dL — ABNORMAL HIGH (ref 12.0–15.0)
MCH: 28.5 pg (ref 26.0–34.0)
MCHC: 34.5 g/dL (ref 30.0–36.0)
MCV: 82.6 fL (ref 80.0–100.0)
Platelets: 281 10*3/uL (ref 150–400)
RBC: 5.62 MIL/uL — ABNORMAL HIGH (ref 3.87–5.11)
RDW: 14.7 % (ref 11.5–15.5)
WBC: 13.8 10*3/uL — ABNORMAL HIGH (ref 4.0–10.5)
nRBC: 0 % (ref 0.0–0.2)

## 2022-07-10 LAB — URINALYSIS, ROUTINE W REFLEX MICROSCOPIC
Bilirubin Urine: NEGATIVE
Glucose, UA: NEGATIVE mg/dL
Ketones, ur: NEGATIVE mg/dL
Leukocytes,Ua: NEGATIVE
Nitrite: NEGATIVE
Protein, ur: NEGATIVE mg/dL
Specific Gravity, Urine: 1.02 (ref 1.005–1.030)
pH: 7 (ref 5.0–8.0)

## 2022-07-10 LAB — COMPREHENSIVE METABOLIC PANEL
ALT: 23 U/L (ref 0–44)
AST: 33 U/L (ref 15–41)
Albumin: 3.6 g/dL (ref 3.5–5.0)
Alkaline Phosphatase: 214 U/L — ABNORMAL HIGH (ref 38–126)
Anion gap: 13 (ref 5–15)
BUN: 10 mg/dL (ref 6–20)
CO2: 26 mmol/L (ref 22–32)
Calcium: 9.6 mg/dL (ref 8.9–10.3)
Chloride: 96 mmol/L — ABNORMAL LOW (ref 98–111)
Creatinine, Ser: 0.71 mg/dL (ref 0.44–1.00)
GFR, Estimated: >60 mL/min (ref >60)
Glucose, Bld: 137 mg/dL — ABNORMAL HIGH (ref 70–99)
Potassium: 3.5 mmol/L (ref 3.5–5.1)
Sodium: 135 mmol/L (ref 135–145)
Total Bilirubin: 0.8 mg/dL (ref 0.3–1.2)
Total Protein: 8.1 g/dL (ref 6.5–8.1)

## 2022-07-10 LAB — URINALYSIS, MICROSCOPIC (REFLEX)

## 2022-07-10 LAB — PREGNANCY, URINE: Preg Test, Ur: NEGATIVE

## 2022-07-10 LAB — LIPASE, BLOOD: Lipase: 28 U/L (ref 11–51)

## 2022-07-10 MED ORDER — MORPHINE SULFATE (PF) 4 MG/ML IV SOLN
4.0000 mg | Freq: Once | INTRAVENOUS | Status: AC
  Administered 2022-07-10: 4 mg via INTRAVENOUS
  Filled 2022-07-10: qty 1

## 2022-07-10 MED ORDER — SODIUM CHLORIDE 0.9 % IV BOLUS
1000.0000 mL | Freq: Once | INTRAVENOUS | Status: AC
  Administered 2022-07-10: 1000 mL via INTRAVENOUS

## 2022-07-10 MED ORDER — IOHEXOL 300 MG/ML  SOLN
100.0000 mL | Freq: Once | INTRAMUSCULAR | Status: AC | PRN
Start: 2022-07-10 — End: 2022-07-10
  Administered 2022-07-10: 100 mL via INTRAVENOUS

## 2022-07-10 MED ORDER — HYDROMORPHONE HCL 1 MG/ML IJ SOLN
1.0000 mg | Freq: Once | INTRAMUSCULAR | Status: AC
  Administered 2022-07-10: 1 mg via INTRAVENOUS
  Filled 2022-07-10: qty 1

## 2022-07-10 MED ORDER — ONDANSETRON HCL 4 MG/2ML IJ SOLN
4.0000 mg | Freq: Once | INTRAMUSCULAR | Status: AC
  Administered 2022-07-10: 4 mg via INTRAVENOUS
  Filled 2022-07-10: qty 2

## 2022-07-10 NOTE — Discharge Instructions (Addendum)
Follow-up for outpatient MRI of your abdomen to further evaluate the abnormality noted in your liver.  Continue taking oxycodone as previously prescribed as needed for pain.  Follow-up with primary doctor in the next week, and return to the ER if symptoms significantly worsen or change.

## 2022-07-10 NOTE — ED Provider Notes (Signed)
[place] EMERGENCY DEPARTMENT Provider Note   CSN: [id number] Arrival date & time: 07/09/22  2324     History  Chief Complaint  Patient presents with   Abdominal Pain    Patricia Parker is a 51 y.o. female.  Patient is a 50 year old female with past medical history of renal cell carcinoma with nephrectomy in 2021 performed at [facility].  Patient presenting today with complaints of left upper quadrant pain.  This started suddenly earlier this evening in the absence of any injury or trauma.  She describes a constant pain to the left upper quadrant that is worse when she moves and attempts to sit up.  She denies any nausea or vomiting.  She denies any diarrhea or constipation.  She denies any chest pain or difficulty breathing.  The history is provided by the patient.  Abdominal Pain Pain location:  LUQ Pain quality: sharp   Pain radiates to:  Does not radiate Pain severity:  Severe Onset quality:  Sudden Timing:  Constant Progression:  Unchanged Worsened by:  Movement, palpation and position changes Ineffective treatments:  None tried      Home Medications Prior to Admission medications   Medication Sig Start Date End Date Taking? Authorizing Provider  aspirin-acetaminophen-caffeine (EXCEDRIN MIGRAINE) [phone] MG per tablet Take 4 tablets by mouth every 8 (eight) hours as needed for headache.    [provider]  Blood Glucose Monitoring Suppl (ONE TOUCH ULTRA 2) w/Device KIT 1 each daily as needed. 12/16/14   [provider]  cyclobenzaprine (FLEXERIL) 10 MG tablet Take 10 mg 3 (three) times daily as needed by mouth for muscle spasms.    [provider]  esomeprazole (NEXIUM) 40 MG capsule Take 40 mg by mouth daily at 12 noon.    [provider]  fenofibrate 160 MG tablet Take 160 mg by mouth daily.    [provider]  glimepiride (AMARYL) 4 MG tablet Take 8 mg daily before breakfast by mouth. 08/24/17   [provider]  Glucose Blood (BLOOD GLUCOSE TEST STRIPS) STRP 1 each daily as needed. 11/07/13   [provider]  HYDROcodone-acetaminophen (NORCO/VICODIN) 5-325 MG per tablet Take 1 tablet by mouth every 6 (six) hours as needed for moderate pain.    [provider]  ibuprofen (ADVIL,MOTRIN) 800 MG tablet Take 800 mg every 8 (eight) hours as needed by mouth. 10/29/17   [provider]  lisinopril-hydrochlorothiazide (PRINZIDE,ZESTORETIC) 20-25 MG per tablet Take 1 tablet by mouth daily.    [provider]  metFORMIN (GLUCOPHAGE) 500 MG tablet Take 500 mg 2 (two) times daily by mouth. 10/25/17   [provider]  Jonetta Speak LANCETS 67T MISC 1 each daily as needed. 12/16/14   [provider]  potassium chloride (K-DUR,KLOR-CON) 10 MEQ tablet Take 20 mEq daily by mouth.     [provider]  pregabalin (LYRICA) 25 MG capsule Take 25 mg 2 (two) times daily by mouth.    [provider]  promethazine (PHENERGAN) 25 MG tablet Take 25 mg every 6 (six) hours as needed by mouth for nausea.  08/30/17   [provider]  pseudoephedrine (SUDAFED) 60 MG tablet Take 1 tablet (60 mg total) by mouth every 8 (eight) hours. For four days 02/07/14   Carmin Muskrat, MD  rizatriptan (MAXALT-MLT) 10 MG disintegrating tablet Take 10 mg daily as needed by mouth. 10/18/17   [provider]  simvastatin (ZOCOR) 40 MG tablet Take 40 mg by mouth daily.  [provider]  sitaGLIPtin (JANUVIA) 100 MG tablet Take 100 mg by mouth daily.    [provider]  tiZANidine (ZANAFLEX) 4 MG tablet Take 4 mg by mouth every 6 (six) hours as needed for muscle spasms.    [provider]  triamcinolone (NASACORT AQ) 55 MCG/ACT AERO nasal inhaler Place 2 sprays into the nose daily. For four days 02/07/14 02/11/14  Carmin Muskrat, MD      Allergies    Actos [pioglitazone], Penicillins, and Latex    Review of Systems    Review of Systems  Gastrointestinal:  Positive for abdominal pain.  All other systems reviewed and are negative.   Physical Exam Updated Vital Signs BP 95/64 (BP Location: Right Arm)   Pulse 95   Temp 99.6 F (37.6 C) (Oral)   Resp (!) 24   Ht _0  (1.626 m)   Wt 92.1 kg   LMP 07/07/2022   SpO2 100%   BMI 34.84 kg/m  Physical Exam Vitals and nursing note reviewed.  Constitutional:      General: She is not in acute distress.    Appearance: She is well-developed. She is not diaphoretic.  HENT:     Head: Normocephalic and atraumatic.  Cardiovascular:     Rate and Rhythm: Normal rate and regular rhythm.     Heart sounds: No murmur heard.    No friction rub. No gallop.  Pulmonary:     Effort: Pulmonary effort is normal. No respiratory distress.     Breath sounds: Normal breath sounds. No wheezing.  Abdominal:     General: Bowel sounds are normal. There is no distension.     Palpations: Abdomen is soft.     Tenderness: There is abdominal tenderness in the left upper quadrant. There is no right CVA tenderness, left CVA tenderness, guarding or rebound.  Musculoskeletal:        General: Normal range of motion.     Cervical back: Normal range of motion and neck supple.  Skin:    General: Skin is warm and dry.  Neurological:     General: No focal deficit present.     Mental Status: She is alert and oriented to person, place, and time.     ED Results / Procedures / Treatments   Labs (all labs ordered are listed, but only abnormal results are displayed) Labs Reviewed  COMPREHENSIVE METABOLIC PANEL - Abnormal; Notable for the following components:      Result Value   Chloride 96 (*)    Glucose, Bld 137 (*)    Alkaline Phosphatase 214 (*)    All other components within normal limits  CBC - Abnormal; Notable for the following components:   WBC 13.8 (*)    RBC 5.62 (*)    Hemoglobin 16.0 (*)    HCT 46.4 (*)    All other components within normal limits  URINALYSIS,  ROUTINE W REFLEX MICROSCOPIC - Abnormal; Notable for the following components:   Hgb urine dipstick MODERATE (*)    All other components within normal limits  URINALYSIS, MICROSCOPIC (REFLEX) - Abnormal; Notable for the following components:   Bacteria, UA RARE (*)    All other components within normal limits  LIPASE, BLOOD  PREGNANCY, URINE    EKG None  Radiology No results found.  Procedures Procedures    Medications Ordered in ED Medications  sodium chloride 0.9 % bolus 1,000 mL (has no administration in time range)  ondansetron (ZOFRAN) injection 4 mg (has no administration  in time range)  morphine (PF) 4 MG/ML injection 4 mg (has no administration in time range)    ED Course/ Medical Decision Making/ A&P  This patient presents to the ED for concern of left upper quadrant pain, this involves an extensive number of treatment options, and is a complaint that carries with it a high risk of complications and morbidity.  The differential diagnosis includes pancreatitis, small bowel obstruction, perforated viscus, diverticulitis   Co morbidities that complicate the patient evaluation  None   Additional history obtained:  External records from outside source obtained and reviewed including records from [place] from 2021   Lab Tests:  I Ordered, and personally interpreted labs.  The pertinent results include: Slight leukocytosis with white count of 13.8, but otherwise unremarkable laboratory studies   Imaging Studies ordered:  I ordered imaging studies including CT scan of the abdomen and pelvis I independently visualized and interpreted imaging which showed a mass within the liver concerning for primary hepatocellular carcinoma or possibly metastatic disease I agree with the radiologist interpretation   Cardiac Monitoring: / EKG:  None performed   Consultations Obtained:  No consultations needed   Problem List / ED Course / Critical interventions /  Medication management  Patient presenting with left upper quadrant pain as described in the HPI, the etiology of which I am uncertain.  She does have CT scan showing multifocal masses within the liver concerning for metastatic disease versus primary hepatocellular carcinoma.  Patient informed of these findings and will be set up for outpatient MRI.  She is to follow-up results with primary doctor. I ordered medication including morphine and Dilaudid for pain Reevaluation of the patient after these medicines showed that the patient improved I have reviewed the patients home medicines and have made adjustments as needed   Social Determinants of Health:  None   Test / Admission - Considered:  No emergent cause of the patient's pain identified and seems appropriate for discharge.  I will prescribe pain medication and have her follow-up for outpatient MRI.  Final Clinical Impression(s) / ED Diagnoses Final diagnoses:  None    Rx / DC Orders ED Discharge Orders     None         Veryl Speak, MD 07/10/22 [phone]

## 2023-07-22 ENCOUNTER — Inpatient Hospital Stay (HOSPITAL_COMMUNITY)
Admission: EM | Admit: 2023-07-22 | Discharge: 2023-08-26 | DRG: 871 | Disposition: E | Payer: Medicare Other | Attending: Pulmonary Disease | Admitting: Pulmonary Disease

## 2023-07-22 ENCOUNTER — Other Ambulatory Visit: Payer: Self-pay

## 2023-07-22 ENCOUNTER — Inpatient Hospital Stay (HOSPITAL_COMMUNITY): Payer: Medicare Other

## 2023-07-22 ENCOUNTER — Emergency Department (HOSPITAL_COMMUNITY): Payer: Medicare Other

## 2023-07-22 ENCOUNTER — Encounter (HOSPITAL_COMMUNITY): Payer: Self-pay | Admitting: Pulmonary Disease

## 2023-07-22 DIAGNOSIS — K831 Obstruction of bile duct: Secondary | ICD-10-CM | POA: Diagnosis present

## 2023-07-22 DIAGNOSIS — I10 Essential (primary) hypertension: Secondary | ICD-10-CM | POA: Diagnosis present

## 2023-07-22 DIAGNOSIS — J9811 Atelectasis: Secondary | ICD-10-CM | POA: Diagnosis present

## 2023-07-22 DIAGNOSIS — R6520 Severe sepsis without septic shock: Secondary | ICD-10-CM | POA: Diagnosis present

## 2023-07-22 DIAGNOSIS — Z888 Allergy status to other drugs, medicaments and biological substances status: Secondary | ICD-10-CM

## 2023-07-22 DIAGNOSIS — E8809 Other disorders of plasma-protein metabolism, not elsewhere classified: Secondary | ICD-10-CM | POA: Diagnosis present

## 2023-07-22 DIAGNOSIS — D630 Anemia in neoplastic disease: Secondary | ICD-10-CM | POA: Diagnosis present

## 2023-07-22 DIAGNOSIS — K652 Spontaneous bacterial peritonitis: Secondary | ICD-10-CM | POA: Diagnosis present

## 2023-07-22 DIAGNOSIS — K7200 Acute and subacute hepatic failure without coma: Secondary | ICD-10-CM | POA: Diagnosis present

## 2023-07-22 DIAGNOSIS — C787 Secondary malignant neoplasm of liver and intrahepatic bile duct: Secondary | ICD-10-CM | POA: Diagnosis present

## 2023-07-22 DIAGNOSIS — Z905 Acquired absence of kidney: Secondary | ICD-10-CM

## 2023-07-22 DIAGNOSIS — R188 Other ascites: Secondary | ICD-10-CM | POA: Diagnosis present

## 2023-07-22 DIAGNOSIS — R4182 Altered mental status, unspecified: Secondary | ICD-10-CM | POA: Diagnosis not present

## 2023-07-22 DIAGNOSIS — G9340 Encephalopathy, unspecified: Secondary | ICD-10-CM | POA: Diagnosis not present

## 2023-07-22 DIAGNOSIS — Z1152 Encounter for screening for COVID-19: Secondary | ICD-10-CM

## 2023-07-22 DIAGNOSIS — M549 Dorsalgia, unspecified: Secondary | ICD-10-CM | POA: Diagnosis present

## 2023-07-22 DIAGNOSIS — C7889 Secondary malignant neoplasm of other digestive organs: Secondary | ICD-10-CM | POA: Diagnosis not present

## 2023-07-22 DIAGNOSIS — E7800 Pure hypercholesterolemia, unspecified: Secondary | ICD-10-CM | POA: Diagnosis present

## 2023-07-22 DIAGNOSIS — Z79899 Other long term (current) drug therapy: Secondary | ICD-10-CM

## 2023-07-22 DIAGNOSIS — X58XXXA Exposure to other specified factors, initial encounter: Secondary | ICD-10-CM | POA: Diagnosis present

## 2023-07-22 DIAGNOSIS — E874 Mixed disorder of acid-base balance: Secondary | ICD-10-CM | POA: Diagnosis present

## 2023-07-22 DIAGNOSIS — A419 Sepsis, unspecified organism: Principal | ICD-10-CM | POA: Diagnosis present

## 2023-07-22 DIAGNOSIS — Z91040 Latex allergy status: Secondary | ICD-10-CM

## 2023-07-22 DIAGNOSIS — E279 Disorder of adrenal gland, unspecified: Secondary | ICD-10-CM | POA: Diagnosis present

## 2023-07-22 DIAGNOSIS — Z7984 Long term (current) use of oral hypoglycemic drugs: Secondary | ICD-10-CM

## 2023-07-22 DIAGNOSIS — Z85528 Personal history of other malignant neoplasm of kidney: Secondary | ICD-10-CM

## 2023-07-22 DIAGNOSIS — T451X5A Adverse effect of antineoplastic and immunosuppressive drugs, initial encounter: Secondary | ICD-10-CM | POA: Diagnosis present

## 2023-07-22 DIAGNOSIS — Z66 Do not resuscitate: Secondary | ICD-10-CM | POA: Diagnosis present

## 2023-07-22 DIAGNOSIS — E876 Hypokalemia: Secondary | ICD-10-CM | POA: Diagnosis present

## 2023-07-22 DIAGNOSIS — F17200 Nicotine dependence, unspecified, uncomplicated: Secondary | ICD-10-CM | POA: Diagnosis present

## 2023-07-22 DIAGNOSIS — R7989 Other specified abnormal findings of blood chemistry: Secondary | ICD-10-CM | POA: Diagnosis present

## 2023-07-22 DIAGNOSIS — J9690 Respiratory failure, unspecified, unspecified whether with hypoxia or hypercapnia: Secondary | ICD-10-CM | POA: Diagnosis present

## 2023-07-22 DIAGNOSIS — Z9221 Personal history of antineoplastic chemotherapy: Secondary | ICD-10-CM

## 2023-07-22 DIAGNOSIS — D6959 Other secondary thrombocytopenia: Secondary | ICD-10-CM | POA: Diagnosis present

## 2023-07-22 DIAGNOSIS — J9600 Acute respiratory failure, unspecified whether with hypoxia or hypercapnia: Principal | ICD-10-CM

## 2023-07-22 DIAGNOSIS — M1990 Unspecified osteoarthritis, unspecified site: Secondary | ICD-10-CM | POA: Diagnosis present

## 2023-07-22 DIAGNOSIS — E11649 Type 2 diabetes mellitus with hypoglycemia without coma: Secondary | ICD-10-CM | POA: Diagnosis present

## 2023-07-22 DIAGNOSIS — Z6837 Body mass index (BMI) 37.0-37.9, adult: Secondary | ICD-10-CM | POA: Diagnosis not present

## 2023-07-22 DIAGNOSIS — Z9049 Acquired absence of other specified parts of digestive tract: Secondary | ICD-10-CM

## 2023-07-22 DIAGNOSIS — N179 Acute kidney failure, unspecified: Secondary | ICD-10-CM | POA: Diagnosis present

## 2023-07-22 DIAGNOSIS — E162 Hypoglycemia, unspecified: Secondary | ICD-10-CM | POA: Diagnosis not present

## 2023-07-22 DIAGNOSIS — E43 Unspecified severe protein-calorie malnutrition: Secondary | ICD-10-CM | POA: Diagnosis present

## 2023-07-22 DIAGNOSIS — Z515 Encounter for palliative care: Secondary | ICD-10-CM

## 2023-07-22 DIAGNOSIS — G8929 Other chronic pain: Secondary | ICD-10-CM | POA: Diagnosis present

## 2023-07-22 DIAGNOSIS — K219 Gastro-esophageal reflux disease without esophagitis: Secondary | ICD-10-CM | POA: Diagnosis present

## 2023-07-22 DIAGNOSIS — G4733 Obstructive sleep apnea (adult) (pediatric): Secondary | ICD-10-CM | POA: Diagnosis present

## 2023-07-22 DIAGNOSIS — Z880 Allergy status to penicillin: Secondary | ICD-10-CM

## 2023-07-22 DIAGNOSIS — G9341 Metabolic encephalopathy: Secondary | ICD-10-CM | POA: Diagnosis present

## 2023-07-22 DIAGNOSIS — Z923 Personal history of irradiation: Secondary | ICD-10-CM

## 2023-07-22 DIAGNOSIS — K7201 Acute and subacute hepatic failure with coma: Secondary | ICD-10-CM | POA: Diagnosis not present

## 2023-07-22 DIAGNOSIS — D6481 Anemia due to antineoplastic chemotherapy: Secondary | ICD-10-CM | POA: Diagnosis present

## 2023-07-22 DIAGNOSIS — E669 Obesity, unspecified: Secondary | ICD-10-CM | POA: Diagnosis present

## 2023-07-22 HISTORY — DX: Liver cell carcinoma: C22.0

## 2023-07-22 LAB — BLOOD GAS, ARTERIAL
Acid-base deficit: 0.9 mmol/L (ref 0.0–2.0)
Acid-base deficit: 3.4 mmol/L — ABNORMAL HIGH (ref 0.0–2.0)
Bicarbonate: 20.7 mmol/L (ref 20.0–28.0)
Bicarbonate: 19.5 mmol/L — ABNORMAL LOW (ref 20.0–28.0)
Drawn by: 22179
Drawn by: 20012
FIO2: 100 %
FIO2: 35 %
MECHVT: 400 mL
O2 Saturation: 100 %
O2 Saturation: 100 %
PEEP: 5 cmH2O
Patient temperature: 36.7
Patient temperature: 38.5
RATE: 18 resp/min
pCO2 arterial: 26 mmHg — ABNORMAL LOW (ref 32–48)
pCO2 arterial: 30 mmHg — ABNORMAL LOW (ref 32–48)
pH, Arterial: 7.51 — ABNORMAL HIGH (ref 7.35–7.45)
pH, Arterial: 7.43 (ref 7.35–7.45)
pO2, Arterial: 194 mmHg — ABNORMAL HIGH (ref 83–108)
pO2, Arterial: 121 mmHg — ABNORMAL HIGH (ref 83–108)

## 2023-07-22 LAB — URINALYSIS, W/ REFLEX TO CULTURE (INFECTION SUSPECTED)
Bacteria, UA: NONE SEEN
Glucose, UA: NEGATIVE mg/dL
Hgb urine dipstick: NEGATIVE
Ketones, ur: NEGATIVE mg/dL
Leukocytes,Ua: NEGATIVE
Nitrite: NEGATIVE
Protein, ur: 100 mg/dL — AB
Specific Gravity, Urine: 1.023 (ref 1.005–1.030)
pH: 5 (ref 5.0–8.0)

## 2023-07-22 LAB — I-STAT CHEM 8, ED
BUN: 24 mg/dL — ABNORMAL HIGH (ref 6–20)
Calcium, Ion: 1.17 mmol/L (ref 1.15–1.40)
Chloride: 104 mmol/L (ref 98–111)
Creatinine, Ser: 1.2 mg/dL — ABNORMAL HIGH (ref 0.44–1.00)
Glucose, Bld: 76 mg/dL (ref 70–99)
HCT: 34 % — ABNORMAL LOW (ref 36.0–46.0)
Hemoglobin: 11.6 g/dL — ABNORMAL LOW (ref 12.0–15.0)
Potassium: 3.4 mmol/L — ABNORMAL LOW (ref 3.5–5.1)
Sodium: 141 mmol/L (ref 135–145)
TCO2: 23 mmol/L (ref 22–32)

## 2023-07-22 LAB — CBC WITH DIFFERENTIAL/PLATELET
Abs Immature Granulocytes: 0.2 10*3/uL — ABNORMAL HIGH (ref 0.00–0.07)
Band Neutrophils: 9 %
Basophils Absolute: 0 10*3/uL (ref 0.0–0.1)
Basophils Relative: 0 %
Eosinophils Absolute: 0 10*3/uL (ref 0.0–0.5)
Eosinophils Relative: 0 %
HCT: 35.1 % — ABNORMAL LOW (ref 36.0–46.0)
Hemoglobin: 11.3 g/dL — ABNORMAL LOW (ref 12.0–15.0)
Lymphocytes Relative: 3 %
Lymphs Abs: 0.2 10*3/uL — ABNORMAL LOW (ref 0.7–4.0)
MCH: 31.5 pg (ref 26.0–34.0)
MCHC: 32.2 g/dL (ref 30.0–36.0)
MCV: 97.8 fL (ref 80.0–100.0)
Metamyelocytes Relative: 3 %
Monocytes Absolute: 0.7 10*3/uL (ref 0.1–1.0)
Monocytes Relative: 9 %
Neutro Abs: 6.7 10*3/uL (ref 1.7–7.7)
Neutrophils Relative %: 76 %
Platelets: 96 10*3/uL — ABNORMAL LOW (ref 150–400)
RBC: 3.59 MIL/uL — ABNORMAL LOW (ref 3.87–5.11)
RDW: 22.3 % — ABNORMAL HIGH (ref 11.5–15.5)
WBC: 7.9 10*3/uL (ref 4.0–10.5)
nRBC: 0 % (ref 0.0–0.2)

## 2023-07-22 LAB — BODY FLUID CULTURE W GRAM STAIN

## 2023-07-22 LAB — GLUCOSE, CAPILLARY
Glucose-Capillary: 106 mg/dL — ABNORMAL HIGH (ref 70–99)
Glucose-Capillary: 62 mg/dL — ABNORMAL LOW (ref 70–99)
Glucose-Capillary: 68 mg/dL — ABNORMAL LOW (ref 70–99)
Glucose-Capillary: 77 mg/dL (ref 70–99)
Glucose-Capillary: 86 mg/dL (ref 70–99)
Glucose-Capillary: 87 mg/dL (ref 70–99)

## 2023-07-22 LAB — BODY FLUID CELL COUNT WITH DIFFERENTIAL
Eos, Fluid: 0 %
Lymphs, Fluid: 1 %
Monocyte-Macrophage-Serous Fluid: 34 % — ABNORMAL LOW (ref 50–90)
Neutrophil Count, Fluid: 65 % — ABNORMAL HIGH (ref 0–25)
Total Nucleated Cell Count, Fluid: 9926 cu mm — ABNORMAL HIGH (ref 0–1000)

## 2023-07-22 LAB — CULTURE, BLOOD (ROUTINE X 2): Special Requests: ADEQUATE

## 2023-07-22 LAB — LACTATE DEHYDROGENASE, PLEURAL OR PERITONEAL FLUID: LD, Fluid: 1168 U/L — ABNORMAL HIGH (ref 3–23)

## 2023-07-22 LAB — GAMMA GT: GGT: 435 U/L — ABNORMAL HIGH (ref 7–50)

## 2023-07-22 LAB — COMPREHENSIVE METABOLIC PANEL
ALT: 26 U/L (ref 0–44)
AST: 88 U/L — ABNORMAL HIGH (ref 15–41)
Albumin: 1.7 g/dL — ABNORMAL LOW (ref 3.5–5.0)
Alkaline Phosphatase: 308 U/L — ABNORMAL HIGH (ref 38–126)
Anion gap: 9 (ref 5–15)
BUN: 27 mg/dL — ABNORMAL HIGH (ref 6–20)
CO2: 24 mmol/L (ref 22–32)
Calcium: 8.2 mg/dL — ABNORMAL LOW (ref 8.9–10.3)
Chloride: 103 mmol/L (ref 98–111)
Creatinine, Ser: 1.12 mg/dL — ABNORMAL HIGH (ref 0.44–1.00)
GFR, Estimated: 59 mL/min — ABNORMAL LOW (ref >60)
Glucose, Bld: 82 mg/dL (ref 70–99)
Potassium: 3.3 mmol/L — ABNORMAL LOW (ref 3.5–5.1)
Sodium: 136 mmol/L (ref 135–145)
Total Bilirubin: 7.6 mg/dL — ABNORMAL HIGH (ref 0.3–1.2)
Total Protein: 5.7 g/dL — ABNORMAL LOW (ref 6.5–8.1)

## 2023-07-22 LAB — RESP PANEL BY RT-PCR (RSV, FLU A&B, COVID)  RVPGX2
Influenza A by PCR: NEGATIVE
Influenza B by PCR: NEGATIVE
Resp Syncytial Virus by PCR: NEGATIVE
SARS Coronavirus 2 by RT PCR: NEGATIVE

## 2023-07-22 LAB — CBG MONITORING, ED
Glucose-Capillary: 86 mg/dL (ref 70–99)
Glucose-Capillary: 22 mg/dL — CL (ref 70–99)
Glucose-Capillary: 104 mg/dL — ABNORMAL HIGH (ref 70–99)

## 2023-07-22 LAB — BILIRUBIN, DIRECT: Bilirubin, Direct: 4.3 mg/dL — ABNORMAL HIGH (ref 0.0–0.2)

## 2023-07-22 LAB — ALBUMIN: Albumin: 1.5 g/dL — ABNORMAL LOW (ref 3.5–5.0)

## 2023-07-22 LAB — PROTEIN, PLEURAL OR PERITONEAL FLUID: Total protein, fluid: <3 g/dL

## 2023-07-22 LAB — PHOSPHORUS
Phosphorus: 4 mg/dL (ref 2.5–4.6)
Phosphorus: 4 mg/dL (ref 2.5–4.6)

## 2023-07-22 LAB — ALBUMIN, PLEURAL OR PERITONEAL FLUID: Albumin, Fluid: <1.5 g/dL

## 2023-07-22 LAB — PROTIME-INR
INR: 1.9 — ABNORMAL HIGH (ref 0.8–1.2)
Prothrombin Time: 21.6 seconds — ABNORMAL HIGH (ref 11.4–15.2)

## 2023-07-22 LAB — LACTATE DEHYDROGENASE: LDH: 735 U/L — ABNORMAL HIGH (ref 98–192)

## 2023-07-22 LAB — LACTIC ACID, PLASMA
Lactic Acid, Venous: 3.8 mmol/L (ref 0.5–1.9)
Lactic Acid, Venous: 3.9 mmol/L (ref 0.5–1.9)

## 2023-07-22 LAB — MAGNESIUM
Magnesium: 1.8 mg/dL (ref 1.7–2.4)
Magnesium: 1.7 mg/dL (ref 1.7–2.4)

## 2023-07-22 LAB — APTT: aPTT: 34 seconds (ref 24–36)

## 2023-07-22 LAB — MRSA NEXT GEN BY PCR, NASAL: MRSA by PCR Next Gen: NOT DETECTED

## 2023-07-22 LAB — GLUCOSE, PLEURAL OR PERITONEAL FLUID: Glucose, Fluid: 78 mg/dL

## 2023-07-22 LAB — ACETAMINOPHEN LEVEL: Acetaminophen (Tylenol), Serum: <10 ug/mL — ABNORMAL LOW (ref 10–30)

## 2023-07-22 LAB — PROTEIN, TOTAL: Total Protein: 5.1 g/dL — ABNORMAL LOW (ref 6.5–8.1)

## 2023-07-22 LAB — SALICYLATE LEVEL: Salicylate Lvl: <7 mg/dL — ABNORMAL LOW (ref 7.0–30.0)

## 2023-07-22 LAB — AMMONIA: Ammonia: 25 umol/L (ref 9–35)

## 2023-07-22 MED ORDER — FENTANYL CITRATE PF 50 MCG/ML IJ SOSY
50.0000 ug | PREFILLED_SYRINGE | INTRAMUSCULAR | Status: DC | PRN
  Administered 2023-07-22 – 2023-07-23 (×4): 100 ug via INTRAVENOUS
  Filled 2023-07-22: qty 2

## 2023-07-22 MED ORDER — DEXTROSE-SODIUM CHLORIDE 5-0.9 % IV SOLN: INTRAVENOUS | Status: DC

## 2023-07-22 MED ORDER — DOCUSATE SODIUM 50 MG/5ML PO LIQD
100.0000 mg | Freq: Two times a day (BID) | ORAL | Status: DC
  Administered 2023-07-22 – 2023-07-24 (×3): 100 mg
  Filled 2023-07-22 (×3): qty 10

## 2023-07-22 MED ORDER — DEXTROSE 50 % IV SOLN
INTRAVENOUS | Status: AC
  Filled 2023-07-22: qty 50

## 2023-07-22 MED ORDER — ETOMIDATE 2 MG/ML IV SOLN
INTRAVENOUS | Status: AC
  Filled 2023-07-22: qty 10

## 2023-07-22 MED ORDER — PANTOPRAZOLE SODIUM 40 MG PO TBEC: 40.0000 mg | DELAYED_RELEASE_TABLET | Freq: Every day | ORAL | Status: DC

## 2023-07-22 MED ORDER — MIDAZOLAM HCL 2 MG/2ML IJ SOLN
1.0000 mg | INTRAMUSCULAR | Status: DC | PRN
  Administered 2023-07-22 (×2): 1 mg via INTRAVENOUS
  Filled 2023-07-22: qty 2

## 2023-07-22 MED ORDER — POTASSIUM CHLORIDE 10 MEQ/50ML IV SOLN: 10.0000 meq | INTRAVENOUS | Status: DC

## 2023-07-22 MED ORDER — CHLORHEXIDINE GLUCONATE CLOTH 2 % EX PADS
6.0000 | MEDICATED_PAD | Freq: Every day | CUTANEOUS | Status: DC
  Administered 2023-07-22 – 2023-07-24 (×2): 6 via TOPICAL

## 2023-07-22 MED ORDER — SODIUM CHLORIDE 0.9 % IV SOLN
2.0000 g | Freq: Three times a day (TID) | INTRAVENOUS | Status: DC
  Administered 2023-07-22 – 2023-07-23 (×3): 2 g via INTRAVENOUS
  Filled 2023-07-22 (×3): qty 12.5

## 2023-07-22 MED ORDER — PROSOURCE TF20 ENFIT COMPATIBL EN LIQD
60.0000 mL | Freq: Every day | ENTERAL | Status: DC
  Administered 2023-07-22: 60 mL
  Filled 2023-07-22: qty 60

## 2023-07-22 MED ORDER — VITAL HIGH PROTEIN PO LIQD
1000.0000 mL | ORAL | Status: DC
  Administered 2023-07-22: 1000 mL

## 2023-07-22 MED ORDER — METRONIDAZOLE 500 MG/100ML IV SOLN
500.0000 mg | Freq: Once | INTRAVENOUS | Status: AC
  Administered 2023-07-22: 500 mg via INTRAVENOUS
  Filled 2023-07-22: qty 100

## 2023-07-22 MED ORDER — ORAL CARE MOUTH RINSE: 15.0000 mL | OROMUCOSAL | Status: DC | PRN

## 2023-07-22 MED ORDER — ORAL CARE MOUTH RINSE
15.0000 mL | OROMUCOSAL | Status: DC
  Administered 2023-07-22 – 2023-07-24 (×23): 15 mL via OROMUCOSAL

## 2023-07-22 MED ORDER — FENTANYL CITRATE PF 50 MCG/ML IJ SOSY
50.0000 ug | PREFILLED_SYRINGE | Freq: Once | INTRAMUSCULAR | Status: AC
  Administered 2023-07-22: 50 ug via INTRAVENOUS

## 2023-07-22 MED ORDER — FAMOTIDINE 20 MG PO TABS
20.0000 mg | ORAL_TABLET | Freq: Two times a day (BID) | ORAL | Status: DC
  Administered 2023-07-22 – 2023-07-24 (×4): 20 mg
  Filled 2023-07-22 (×4): qty 1

## 2023-07-22 MED ORDER — DEXTROSE 50 % IV SOLN: 25.0000 mL | Freq: Once | INTRAVENOUS | Status: AC

## 2023-07-22 MED ORDER — POLYETHYLENE GLYCOL 3350 17 G PO PACK
17.0000 g | PACK | Freq: Every day | ORAL | Status: DC
  Administered 2023-07-24: 17 g
  Filled 2023-07-22: qty 1

## 2023-07-22 MED ORDER — SODIUM CHLORIDE 0.9 % IV BOLUS
3000.0000 mL | Freq: Once | INTRAVENOUS | Status: AC
  Administered 2023-07-22: 3000 mL via INTRAVENOUS

## 2023-07-22 MED ORDER — DEXTROSE 50 % IV SOLN
INTRAVENOUS | Status: AC
  Administered 2023-07-22: 50 mL
  Filled 2023-07-22: qty 50

## 2023-07-22 MED ORDER — SUCCINYLCHOLINE CHLORIDE 200 MG/10ML IV SOSY
PREFILLED_SYRINGE | INTRAVENOUS | Status: AC
  Filled 2023-07-22: qty 10

## 2023-07-22 MED ORDER — FENTANYL CITRATE PF 50 MCG/ML IJ SOSY
PREFILLED_SYRINGE | INTRAMUSCULAR | Status: AC
  Filled 2023-07-22: qty 1

## 2023-07-22 MED ORDER — SODIUM CHLORIDE 0.9 % IV SOLN
2.0000 g | Freq: Once | INTRAVENOUS | Status: AC
  Administered 2023-07-22: 2 g via INTRAVENOUS
  Filled 2023-07-22: qty 12.5

## 2023-07-22 MED ORDER — MIDAZOLAM HCL 2 MG/2ML IJ SOLN
INTRAMUSCULAR | Status: AC
  Filled 2023-07-22: qty 2

## 2023-07-22 MED ORDER — FENTANYL 2500MCG IN NS 250ML (10MCG/ML) PREMIX INFUSION
50.0000 ug/h | INTRAVENOUS | Status: DC
  Administered 2023-07-22: 50 ug/h via INTRAVENOUS
  Administered 2023-07-23: 75 ug/h via INTRAVENOUS
  Filled 2023-07-22 (×3): qty 250

## 2023-07-22 MED ORDER — POTASSIUM CHLORIDE 10 MEQ/100ML IV SOLN
10.0000 meq | INTRAVENOUS | Status: AC
  Administered 2023-07-22 (×6): 10 meq via INTRAVENOUS
  Filled 2023-07-22 (×6): qty 100

## 2023-07-22 MED ORDER — FENTANYL CITRATE PF 50 MCG/ML IJ SOSY
50.0000 ug | PREFILLED_SYRINGE | INTRAMUSCULAR | Status: DC | PRN
  Filled 2023-07-22: qty 1

## 2023-07-22 MED ORDER — FENTANYL BOLUS VIA INFUSION
50.0000 ug | INTRAVENOUS | Status: DC | PRN
  Administered 2023-07-23: 50 ug via INTRAVENOUS
  Administered 2023-07-24: 25 ug via INTRAVENOUS
  Administered 2023-07-24: 50 ug via INTRAVENOUS

## 2023-07-22 NOTE — Progress Notes (Signed)
   07/19/2023 1642  Adult Ventilator Settings  Set Rate (S)  16 bmp (Weaned RR to 16.)  FiO2 (%) (S)  28 % (Weaned FI02 to 28%.)  Adult Ventilator Measurements  SpO2 98 %

## 2023-07-22 NOTE — ED Provider Notes (Signed)
[place] EMERGENCY DEPARTMENT AT [facility] Provider Note   CSN: [id number] Arrival date & time: July 27, 2023  8469     History  Chief Complaint  Patient presents with   Loss of Consciousness    Patricia Parker is a 52 y.o. female.  Patient has a history of liver cancer and diabetes.  She is in the process of getting treated for cancer that supposedly has spread.  Going to her son the patient was lethargic yesterday but was able to be helped around.  Today she was unresponsive.  Paramedics arrived and her sugar was 31.  They gave her an amp of dextrose and her sugar came up to 108.  Patient was only responding to painful stimuli and not protecting her airway so she was intubated  The history is provided by the EMS personnel. No language interpreter was used.  Loss of Consciousness Episode history:  Unable to specify Most recent episode:  Today Timing:  Constant Progression:  Worsening Chronicity:  New Context: not blood draw   Witnessed: no   Relieved by:  Nothing Worsened by:  Nothing Ineffective treatments:  None tried Associated symptoms: no anxiety        Home Medications Prior to Admission medications   Medication Sig Start Date End Date Taking? Authorizing Provider  aspirin-acetaminophen-caffeine (EXCEDRIN MIGRAINE) [phone] MG per tablet Take 4 tablets by mouth every 8 (eight) hours as needed for headache.    [provider]  Blood Glucose Monitoring Suppl (ONE TOUCH ULTRA 2) w/Device KIT 1 each daily as needed. 12/16/14   [provider]  cyclobenzaprine (FLEXERIL) 10 MG tablet Take 10 mg 3 (three) times daily as needed by mouth for muscle spasms.    [provider]  esomeprazole (NEXIUM) 40 MG capsule Take 40 mg by mouth daily at 12 noon.    [provider]  fenofibrate 160 MG tablet Take 160 mg by mouth daily.    [provider]  glimepiride (AMARYL) 4 MG tablet Take 8 mg daily before breakfast by mouth.  08/24/17   [provider]  Glucose Blood (BLOOD GLUCOSE TEST STRIPS) STRP 1 each daily as needed. 11/07/13   [provider]  HYDROcodone-acetaminophen (NORCO/VICODIN) 5-325 MG per tablet Take 1 tablet by mouth every 6 (six) hours as needed for moderate pain.    [provider]  ibuprofen (ADVIL,MOTRIN) 800 MG tablet Take 800 mg every 8 (eight) hours as needed by mouth. 10/29/17   [provider]  lisinopril-hydrochlorothiazide (PRINZIDE,ZESTORETIC) 20-25 MG per tablet Take 1 tablet by mouth daily.    [provider]  metFORMIN (GLUCOPHAGE) 500 MG tablet Take 500 mg 2 (two) times daily by mouth. 10/25/17   [provider]  Dola Argyle LANCETS 33G MISC 1 each daily as needed. 12/16/14   [provider]  potassium chloride (K-DUR,KLOR-CON) 10 MEQ tablet Take 20 mEq daily by mouth.     [provider]  pregabalin (LYRICA) 25 MG capsule Take 25 mg 2 (two) times daily by mouth.    [provider]  promethazine (PHENERGAN) 25 MG tablet Take 25 mg every 6 (six) hours as needed by mouth for nausea.  08/30/17   [provider]  pseudoephedrine (SUDAFED) 60 MG tablet Take 1 tablet (60 mg total) by mouth every 8 (eight) hours. For four days 02/07/14   Gerhard Munch, MD  rizatriptan (MAXALT-MLT) 10 MG disintegrating tablet Take 10 mg daily as needed by mouth. 10/18/17   [provider]  simvastatin (ZOCOR) 40 MG tablet Take 40 mg by mouth daily.    [provider]  sitaGLIPtin (JANUVIA) 100 MG tablet Take 100 mg by mouth daily.    [provider]  tiZANidine (ZANAFLEX) 4 MG tablet Take 4 mg by mouth every 6 (six) hours as needed for muscle spasms.    [provider]  triamcinolone (NASACORT AQ) 55 MCG/ACT AERO nasal inhaler Place 2 sprays into the nose daily. For four days 02/07/14 02/11/14  Gerhard Munch, MD      Allergies    Actos [pioglitazone], Penicillins, and Latex    Review  of Systems   Review of Systems  Unable to perform ROS: Acuity of condition  Cardiovascular:  Positive for syncope.    Physical Exam Updated Vital Signs BP 99/60   Pulse (!) 120   Resp (!) 21   Ht 5\' 7"  (1.702 m)   Wt 108.9 kg   LMP  (LMP Unknown)   SpO2 100%   BMI 37.59 kg/m  Physical Exam Vitals and nursing note reviewed.  Constitutional:      Appearance: She is well-developed.     Comments: Responding to painful stimuli only  HENT:     Head: Normocephalic.     Nose: Nose normal.     Mouth/Throat:     Comments: Patient not protecting her airway Eyes:     General: No scleral icterus.    Conjunctiva/sclera: Conjunctivae normal.  Neck:     Thyroid: No thyromegaly.  Cardiovascular:     Rate and Rhythm: Normal rate and regular rhythm.     Heart sounds: No murmur heard.    No friction rub. No gallop.  Pulmonary:     Breath sounds: No stridor. Rales present. No wheezing.  Chest:     Chest wall: No tenderness.  Abdominal:     General: There is no distension.     Tenderness: There is no abdominal tenderness. There is no rebound.  Musculoskeletal:        General: Normal range of motion.     Cervical back: Neck supple.  Lymphadenopathy:     Cervical: No cervical adenopathy.  Skin:    Findings: No erythema or rash.  Neurological:     Motor: No abnormal muscle tone.     Coordination: Coordination normal.     ED Results / Procedures / Treatments   Labs (all labs ordered are listed, but only abnormal results are displayed) Labs Reviewed  LACTIC ACID, PLASMA - Abnormal; Notable for the following components:      Result Value   Lactic Acid, Venous 3.8 (*)    All other components within normal limits  COMPREHENSIVE METABOLIC PANEL - Abnormal; Notable for the following components:   Potassium 3.3 (*)    BUN 27 (*)    Creatinine, Ser 1.12 (*)    Calcium 8.2 (*)    Total Protein 5.7 (*)    Albumin 1.7 (*)    AST 88 (*)    Alkaline Phosphatase 308 (*)    Total  Bilirubin 7.6 (*)    GFR, Estimated 59 (*)    All other components within normal limits  CBC WITH DIFFERENTIAL/PLATELET - Abnormal; Notable for the following components:   RBC 3.59 (*)    Hemoglobin 11.3 (*)    HCT 35.1 (*)    RDW 22.3 (*)    Platelets 96 (*)    Lymphs Abs 0.2 (*)    Abs Immature Granulocytes 0.20 (*)    All  other components within normal limits  PROTIME-INR - Abnormal; Notable for the following components:   Prothrombin Time 21.6 (*)    INR 1.9 (*)    All other components within normal limits  BLOOD GAS, ARTERIAL - Abnormal; Notable for the following components:   pH, Arterial 7.51 (*)    pCO2 arterial 26 (*)    pO2, Arterial 194 (*)    Allens test (pass/fail) NOT APPLICABLE (*)    All other components within normal limits  I-STAT CHEM 8, ED - Abnormal; Notable for the following components:   Potassium 3.4 (*)    BUN 24 (*)    Creatinine, Ser 1.20 (*)    Hemoglobin 11.6 (*)    HCT 34.0 (*)    All other components within normal limits  RESP PANEL BY RT-PCR (RSV, FLU A&B, COVID)  RVPGX2  CULTURE, BLOOD (ROUTINE X 2)  CULTURE, BLOOD (ROUTINE X 2)  AMMONIA  APTT  LACTIC ACID, PLASMA  URINALYSIS, W/ REFLEX TO CULTURE (INFECTION SUSPECTED)  CBG MONITORING, ED  POC URINE PREG, ED    EKG None  Radiology DG Chest 1 View  Result Date: 07/22/2023 CLINICAL DATA:  52 year old female found unresponsive. Hypoglycemia. Intubated. EXAM: CHEST  1 VIEW COMPARISON:  Chest radiographs 10/31/2017. FINDINGS: Portable AP supine views at 1015 hours. Rotated to the left on both views. Endotracheal tube tip is at the level the clavicles. Enteric tube terminates in the stomach, side hole the level of the gastric body. Superimposed right chest Port-A-Cath. Lower lung volumes. Allowing for portable technique and rotation mediastinal contours appear stable, lungs appear clear. No pneumothorax or pleural effusion. Paucity of bowel gas. No acute osseous abnormality identified.  IMPRESSION: 1. Endotracheal tube tip at the level of the clavicles. Satisfactory enteric tube placement into the stomach. 2. Rotated view with lower lung volumes. No acute cardiopulmonary abnormality identified. Electronically Signed   By: Odessa Fleming M.D.   On: 07/22/2023 10:48   CT Head Wo Contrast  Result Date: 07/22/2023 CLINICAL DATA:  52 year old female found unresponsive at home. Hypoglycemia. Possible sepsis, syncope. EXAM: CT HEAD WITHOUT CONTRAST TECHNIQUE: Contiguous axial images were obtained from the base of the skull through the vertex without intravenous contrast. RADIATION DOSE REDUCTION: This exam was performed according to the departmental dose-optimization program which includes automated exposure control, adjustment of the mA and/or kV according to patient size and/or use of iterative reconstruction technique. COMPARISON:  None Available. FINDINGS: Brain: Normal cerebral volume. No midline shift, ventriculomegaly, mass effect, evidence of mass lesion, intracranial hemorrhage or evidence of cortically based acute infarction. Gray-white matter differentiation is within normal limits throughout the brain. Vascular: No hyperdense vessel or unexpected calcification. Skull: Negative. Sinuses/Orbits: Visualized paranasal sinuses and mastoids are clear. Other: Intubated on the scout view. Visualized orbits and scalp soft tissues are within normal limits. IMPRESSION: Normal noncontrast Head CT. Electronically Signed   By: Odessa Fleming M.D.   On: 07/22/2023 10:46    Procedures Procedure Name: Intubation Date/Time: 07/25/2023 4:03 PM  Performed by: Bethann Berkshire, MDPre-anesthesia Checklist: Patient identified Oxygen Delivery Method: Simple face mask Preoxygenation: Pre-oxygenation with 100% oxygen Induction Type: IV induction and Rapid sequence Grade View: Grade II Tube size: 7.5 mm Number of attempts: 1 Placement Confirmation: ETT inserted through vocal cords under direct vision, Positive ETCO2,  CO2 detector and Breath sounds checked- equal and bilateral Comments: Patient was intubated without difficulty        Medications Ordered in ED Medications  metroNIDAZOLE (FLAGYL) IVPB 500 mg (  500 mg Intravenous New Bag/Given Aug 13, 2023 1110)  ceFEPIme (MAXIPIME) 2 g in sodium chloride 0.9 % 100 mL IVPB (has no administration in time range)  succinylcholine (ANECTINE) 200 MG/10ML syringe (has no administration in time range)  ceFEPIme (MAXIPIME) 2 g in sodium chloride 0.9 % 100 mL IVPB (2 g Intravenous New Bag/Given 13-Aug-2023 1044)  sodium chloride 0.9 % bolus 3,000 mL (3,000 mLs Intravenous New Bag/Given 08-13-2023 1048)    ED Course/ Medical Decision Making/ A&P  CRITICAL CARE Performed by: Bethann Berkshire Total critical care time: 80 minutes Critical care time was exclusive of separately billable procedures and treating other patients. Critical care was necessary to treat or prevent imminent or life-threatening deterioration. Critical care was time spent personally by me on the following activities: development of treatment plan with patient and/or surrogate as well as nursing, discussions with consultants, evaluation of patient's response to treatment, examination of patient, obtaining history from patient or surrogate, ordering and performing treatments and interventions, ordering and review of laboratory studies, ordering and review of radiographic studies, pulse oximetry and re-evaluation of patient's condition.                            Medical Decision Making Amount and/or Complexity of Data Reviewed Labs: ordered. Radiology: ordered. ECG/medicine tests: ordered.  Risk Prescription drug management. Decision regarding hospitalization.   Respiratory failure and sepsis.  Patient is going to [facility], ICU        Final Clinical Impression(s) / ED Diagnoses Final diagnoses:  None    Rx / DC Orders ED Discharge Orders     None         Bethann Berkshire,  MD 07/25/23 [phone]

## 2023-07-22 NOTE — Progress Notes (Signed)
   PCCM transfer request    Sending physician: Estell Harpin  Sending facility: [facility]  Reason for transfer: Mechanically ventilated  Brief case summary: History of liver cancer.  Presented with altered mental status due to hypoglycemia intubated for airway protection  Recommendations made prior to transfer: Send to [facility].  Dr. Everardo All will attend   Transfer accepted: yes/no yes    Lynnell Catalan 07/18/2023 1:43 PM [facility]  For contact information, see Amion. If no response to pager, please call PCCM consult pager. After hours, 7PM- 7AM, please call Elink.

## 2023-07-22 NOTE — H&P (Addendum)
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   She was recently in the ED at [facility] from 07/17/23-07/18/23 for hyperglycemia with CBG 537. CT A/P 07/17/23 demonstrated "Progressive disease in the liver. New ascites. Stable splenomegaly and left adrenal mass. Portacaval node larger."  She and her husband lives her son. At baseline she is able to perform her own ADLs but instead was more tired and had poor appetite after being discharged from the ED. Compliant with her meds per son but he does not help manage this. He was unaware that her cancer was not curative and did not realize recent images at [facility] demonstrated progression of her cancer.  She presented to [facility] ED on 07/07/2023 for recent lethargy yesterday and then found unresponsive today with CBG 31 by EMS. Amp of dextrose was given and improved to 108 however patient only responded to pain. Intubated for airway protection. Labs significant K 3.4,  BUN/Cr 24/1.20. AP 308. Tbili 7.6 AST 88 ALT 26.  LA 3.8  Pertinent  Medical History  As above  Significant Hospital Events: Including procedures, antibiotic start and stop dates in addition to other pertinent events   7/28 Transferred from [facility] ED and admitted to [facility]  Interim History / Subjective:  As above  Objective   Blood pressure 111/83, pulse (!) 137, temperature (!) 100.9 F (38.3 C), resp. rate (!) 25, height 5\' 7"  (1.702 m), weight 108.9 kg, SpO2 100%.    Vent Mode:  PRVC FiO2 (%):  [60 %-100 %] 60 % Set Rate:  [20 bmp] 20 bmp Vt Set:  [500 mL] 500 mL PEEP:  [5 cmH20] 5 cmH20 Plateau Pressure:  [16 cmH20-24 cmH20] 16 cmH20  No intake or output data in the 24 hours ending 07/12/2023 1453 Filed Weights   06/28/2023 1032  Weight: 108.9 kg    Physical Exam: General: Appears older than stated age, chronically ill-appearing, no acute distress HENT: [place], AT, ETT in place Eyes: Bilateral conjunctival swelling, PERRL, EOMI, + scleral icterus Respiratory: Clear to auscultation bilaterally.  No crackles, wheezing or rales Cardiovascular: RRR, -M/R/G, no JVD GI: BS+, soft, nontender, abdominal striae  Extremities: 1-2+ pitting edema,-tenderness Neuro: Nonpurposeful movements, does not follow commands, moves extremities x 4  CT head 7/28 NAICA CXR 07/14/2023 Left basilar atelectasis. ETT in place  Resolved Hospital Problem list     Assessment & Plan:   Acute metabolic encephalopathy secondary hypoglycemia, liver failure Refractory hypoglycemia - may be due to taking insulin in setting of poor appetite and renal failure however concerned this is related to her progressive malignancy involving liver -Start D5 NS gtt for fluid resuscitation and glucose -Start TF -Trend CBG -PAD protocol for RASS goal -1 -Minimize sedation, re-orient as above  Acute liver failure Elevated LFTs with cholestatic pattern likely from intra/extrahepatic biliary obstruction d/t malignancy Unresectable metastatic cholangiocarcinoma on chemo s/p palliative radiation Progressive liver mets on 7/23 with new ascites -Trend LFTs, INR -Consult Palliative care -Discussed GOC. Son was unaware cancer was not curative and stated mother  had not shared this and was not aware of progression of liver mets on recent scan.  Intubated for airway protection Respiratory alkalosis - resolved with decreased RR -Full vent support -LTVV, 4-8cc/kg IBW with goal Pplat<30 and DP<15 -VAP  -SBT/WUA when  qualified  Sepsis, Fever - unclear source, possible SBP Lactic acidosis - could be sepsis related vs poor hepatic clearance -CXR with left atelectasis, possible infiltrate and ascites present -Diagnostic paracentesis -Continue Cefepime -F/u cultures -Trend LA  Anemia and thrombocytopenia 2/2 chemo and malignancy -Trend CBC -Transfuse for goal Hg >7 and Plt >7  AKI  -IVF as above -Monitor UOP/Cr  Hypokalemia -Replete  Hypoalbuminemia Severe protein malnutrition -Starting TF -Consult to RD   Best Practice (right click and "Reselect all SmartList Selections" daily)   Diet/type: tubefeeds DVT prophylaxis: prophylactic heparin  GI prophylaxis: H2B Lines: N/A Foley:  N/A Code Status:  full code Last date of multidisciplinary goals of care discussion [ ]  Attempted to call son and father. Unable to leave voicemail  Labs   CBC: Recent Labs  Lab 07/17/2023 1002 07/14/2023 1016  WBC 7.9  --   NEUTROABS 6.7  --   HGB 11.3* 11.6*  HCT 35.1* 34.0*  MCV 97.8  --   PLT 96*  --     Basic Metabolic Panel: Recent Labs  Lab 07/25/2023 1002 07/20/2023 1016  NA 136 141  K 3.3* 3.4*  CL 103 104  CO2 24  --   GLUCOSE 82 76  BUN 27* 24*  CREATININE 1.12* 1.20*  CALCIUM 8.2*  --    GFR: Estimated Creatinine Clearance: 69.7 mL/min (A) (by C-G formula based on SCr of 1.2 mg/dL (H)). Recent Labs  Lab 07/05/2023 1002  WBC 7.9  LATICACIDVEN 3.8*    Liver Function Tests: Recent Labs  Lab 07/06/2023 1002  AST 88*  ALT 26  ALKPHOS 308*  BILITOT 7.6*  PROT 5.7*  ALBUMIN 1.7*   No results for input(s): "LIPASE", "AMYLASE" in the last 168 hours. Recent Labs  Lab 07/19/2023 1002  AMMONIA 25    ABG    Component Value Date/Time   PHART 7.51 (H) 06/27/2023 1122   PCO2ART 26 (L) 07/15/2023 1122   PO2ART 194 (H) 07/09/2023 1122   HCO3 20.7 07/04/2023 1122   TCO2 23 07/12/2023 1016   ACIDBASEDEF 0.9 07/02/2023 1122   O2SAT 100 07/23/2023 1122     Coagulation  Profile: Recent Labs  Lab 07/02/2023 1002  INR 1.9*    Cardiac Enzymes: No results for input(s): "CKTOTAL", "CKMB", "CKMBINDEX", "TROPONINI" in the last 168 hours.  HbA1C: No results found for: "HGBA1C"  CBG: Recent Labs  Lab 07/03/2023 0956 07/20/2023 1306 07/06/2023 1344  GLUCAP 86 22* 104*    Review of Systems:   Unable to obtain due to vented status  Past Medical History:  She,  has a past medical history of Arthritis, Back pain, chronic, DDD (degenerative disc disease), Diabetes mellitus without complication (HCC), HTN (hypertension), Hypercholesterolemia, Obesity, and OSA (obstructive sleep apnea).   Surgical History:   Past Surgical History:  Procedure Laterality Date   CARPAL TUNNEL RELEASE     CHOLECYSTECTOMY     TUBAL LIGATION       Social History:   reports that she has been smoking. She does not have any smokeless tobacco history on file. She reports that she does not drink alcohol and does not use drugs.   Family History:  Her family history is not on file.   Allergies Allergies  Allergen Reactions   Actos [Pioglitazone]    Penicillins    Latex Hives, Itching and Rash     Home Medications  Prior to Admission medications   Medication Sig Start Date End Date Taking? Authorizing Provider  aspirin-acetaminophen-caffeine (EXCEDRIN MIGRAINE) [phone] MG per tablet Take 4 tablets by mouth every 8 (eight) hours as needed for headache.    [provider]  Blood Glucose Monitoring Suppl (ONE TOUCH ULTRA 2) w/Device KIT 1 each daily as needed. 12/16/14   [provider]  cyclobenzaprine (FLEXERIL) 10 MG tablet Take 10 mg 3 (three) times daily as needed by mouth for muscle spasms.    [provider]  esomeprazole (NEXIUM) 40 MG capsule Take 40 mg by mouth daily at 12 noon.    [provider]  fenofibrate 160 MG tablet Take 160 mg by mouth daily.    [provider]  glimepiride (AMARYL) 4 MG tablet Take 8 mg daily  before breakfast by mouth. 08/24/17   [provider]  Glucose Blood (BLOOD GLUCOSE TEST STRIPS) STRP 1 each daily as needed. 11/07/13   [provider]  HYDROcodone-acetaminophen (NORCO/VICODIN) 5-325 MG per tablet Take 1 tablet by mouth every 6 (six) hours as needed for moderate pain.    [provider]  ibuprofen (ADVIL,MOTRIN) 800 MG tablet Take 800 mg every 8 (eight) hours as needed by mouth. 10/29/17   [provider]  lisinopril-hydrochlorothiazide (PRINZIDE,ZESTORETIC) 20-25 MG per tablet Take 1 tablet by mouth daily.    [provider]  metFORMIN (GLUCOPHAGE) 500 MG tablet Take 500 mg 2 (two) times daily by mouth. 10/25/17   [provider]  Dola Argyle LANCETS 33G MISC 1 each daily as needed. 12/16/14   [provider]  potassium chloride (K-DUR,KLOR-CON) 10 MEQ tablet Take 20 mEq daily by mouth.     [provider]  pregabalin (LYRICA) 25 MG capsule Take 25 mg 2 (two) times daily by mouth.    [provider]  promethazine (PHENERGAN) 25 MG tablet Take 25 mg every 6 (six) hours as needed by mouth for nausea.  08/30/17   [provider]  pseudoephedrine (SUDAFED) 60 MG tablet Take 1 tablet (60 mg total) by mouth every 8 (eight) hours. For four days 02/07/14   Gerhard Munch, MD  rizatriptan (MAXALT-MLT) 10 MG disintegrating tablet Take 10 mg daily as needed by mouth. 10/18/17   [provider]  simvastatin (ZOCOR) 40 MG tablet Take 40 mg by mouth daily.    [provider]  sitaGLIPtin (JANUVIA) 100 MG tablet Take 100 mg by mouth daily.    [provider]  tiZANidine (ZANAFLEX) 4 MG tablet Take 4 mg by mouth every 6 (six) hours as needed for muscle spasms.    [provider]  triamcinolone (NASACORT AQ) 55 MCG/ACT AERO nasal inhaler Place 2 sprays into the nose daily. For four days 02/07/14 02/11/14  Gerhard Munch, MD     Critical care time: 60 min    The patient  is critically ill with multiple organ systems failure and requires high complexity decision making for assessment and support, frequent evaluation and titration of therapies, application of advanced monitoring technologies and extensive interpretation of multiple databases.    Mechele Collin, M.D. [facility] Pulmonary/Critical Care Medicine 06/25/2023 2:53 PM   Please see Amion for pager number to reach on-call Pulmonary and Critical Care Team.

## 2023-07-22 NOTE — Progress Notes (Signed)
[facility] following for sepsis protocol. 

## 2023-07-22 NOTE — Sepsis Progress Note (Signed)
AP ED did not obtain a repeat lactic acid before tx. Repeat will be done in [facility] ICU. Thank you.

## 2023-07-22 NOTE — ED Triage Notes (Signed)
Pt arrive [facility] for unresponsive from home. Pt just d/c'd from another facility a few days ago. Family stated pt has been like this since yesterday per [facility]. Pt moaning but does not respond to staff. Pt CBG was 31 on site , EMS gave 1 amp of dextrose and cbg up 108. Pt is a liver cancer pt , possible sepsis so 1 gm Rocephin given in route. BP unable to gain by EMS. 500 cc of NS given.

## 2023-07-22 NOTE — ED Notes (Signed)
CBG 22, so Dextrose 50 was pushed.

## 2023-07-22 NOTE — Progress Notes (Signed)
   07/02/2023 1510  Vent Select  Invasive or Noninvasive Invasive  Adult Vent Y  Adult Ventilator Settings  Vent Type Servo i  Humidity HME  Vent Mode PRVC  Vt Set (S)  400 mL (6 cc/kg ordered.)  Set Rate 18 bmp  FiO2 (%) (S)  35 %  PEEP 5 cmH20  Adult Ventilator Measurements  Peak Airway Pressure 15 L/min  Mean Airway Pressure 7 cmH20  Resp Rate Spontaneous 4 br/min  Resp Rate Total 22 br/min  Exhaled Vt 506 mL  Measured Ve 11.3 L  I:E Ratio Measured 1:1.8  Total PEEP 5 cmH20  SpO2 100 %  Adult Ventilator Alarms  Alarms On Y

## 2023-07-22 NOTE — IPAL (Signed)
Interdisciplinary Goals of Care Family Meeting   Date carried out: 08-14-2023  Location of the meeting: Phone conference  Member's involved: Physician and Family Member or next of kin  Durable Power of Attorney or acting medical decision maker: Quintella Baton, Son    Discussion: We discussed goals of care for Patricia Parker .  We discussed her current conditions including refractory hypoglycemia. We discussed her co-morbidities including her metastatic cancer. He wishes to continue current treatment to see if she can improve but agrees to DNR in the event of cardiac arrest. Otherwise would want medical care as warranted.  Code status:   Code Status: DNR   Disposition: Continue current acute care  Time spent for the meeting: 20 min    Adah Stoneberg Mechele Collin, MD  08/14/23, 5:03 PM

## 2023-07-22 NOTE — Progress Notes (Signed)
Pharmacy Antibiotic Note  Patricia Parker is a 52 y.o. female admitted on 06/30/2023 with  intra-abdominal infection .  Pharmacy has been consulted for cefepime dosing.  Plan: Cefepime 2000 mg IV every 8 hours. Monitor labs, c/s, and patient improvement.     No data recorded.  Recent Labs  Lab 06/27/2023 1016  CREATININE 1.20*    CrCl cannot be calculated (Unknown ideal weight.).    Allergies  Allergen Reactions   Actos [Pioglitazone]    Penicillins    Latex Hives, Itching and Rash    Antimicrobials this admission: Cefepime 7/28 >> Flagyl 7/28 >>   Microbiology results: 7/28 BCx: pending   Thank you for allowing pharmacy to be a part of this patient's care.  Tad Moore 07/05/2023 10:32 AM

## 2023-07-22 NOTE — ED Notes (Signed)
[facility] verbalized the need for fentanyl and versed, meds pulled from [facility].

## 2023-07-22 NOTE — Progress Notes (Signed)
Approximately 1025 patient transported on ventilator to CT  an then back to  ER without issue.

## 2023-07-23 DIAGNOSIS — E162 Hypoglycemia, unspecified: Secondary | ICD-10-CM | POA: Diagnosis not present

## 2023-07-23 DIAGNOSIS — G9340 Encephalopathy, unspecified: Secondary | ICD-10-CM

## 2023-07-23 DIAGNOSIS — R4182 Altered mental status, unspecified: Secondary | ICD-10-CM | POA: Diagnosis not present

## 2023-07-23 LAB — GLUCOSE, CAPILLARY
Glucose-Capillary: 107 mg/dL — ABNORMAL HIGH (ref 70–99)
Glucose-Capillary: 118 mg/dL — ABNORMAL HIGH (ref 70–99)
Glucose-Capillary: 149 mg/dL — ABNORMAL HIGH (ref 70–99)
Glucose-Capillary: 150 mg/dL — ABNORMAL HIGH (ref 70–99)
Glucose-Capillary: 47 mg/dL — ABNORMAL LOW (ref 70–99)
Glucose-Capillary: 61 mg/dL — ABNORMAL LOW (ref 70–99)
Glucose-Capillary: 62 mg/dL — ABNORMAL LOW (ref 70–99)
Glucose-Capillary: 69 mg/dL — ABNORMAL LOW (ref 70–99)
Glucose-Capillary: 78 mg/dL (ref 70–99)
Glucose-Capillary: 87 mg/dL (ref 70–99)
Glucose-Capillary: 92 mg/dL (ref 70–99)
Glucose-Capillary: 97 mg/dL (ref 70–99)
Glucose-Capillary: 99 mg/dL (ref 70–99)

## 2023-07-23 LAB — AMMONIA: Ammonia: 45 umol/L — ABNORMAL HIGH (ref 9–35)

## 2023-07-23 MED ORDER — SODIUM CHLORIDE 0.9 % IV SOLN
2.0000 g | INTRAVENOUS | Status: DC
  Administered 2023-07-23: 2 g via INTRAVENOUS
  Filled 2023-07-23: qty 20

## 2023-07-23 MED ORDER — MAGNESIUM SULFATE 2 GM/50ML IV SOLN
2.0000 g | Freq: Once | INTRAVENOUS | Status: AC
  Administered 2023-07-23: 2 g via INTRAVENOUS
  Filled 2023-07-23: qty 50

## 2023-07-23 MED ORDER — LACTULOSE 10 GM/15ML PO SOLN
30.0000 g | Freq: Two times a day (BID) | ORAL | Status: DC
  Administered 2023-07-23 – 2023-07-24 (×3): 30 g
  Filled 2023-07-23 (×3): qty 45

## 2023-07-23 MED ORDER — DEXTROSE 10 % IV SOLN: INTRAVENOUS | Status: DC

## 2023-07-23 MED ORDER — DEXTROSE 50 % IV SOLN
INTRAVENOUS | Status: AC
  Filled 2023-07-23: qty 50

## 2023-07-23 MED ORDER — POTASSIUM CHLORIDE 10 MEQ/100ML IV SOLN
10.0000 meq | INTRAVENOUS | Status: AC
  Administered 2023-07-23 (×4): 10 meq via INTRAVENOUS
  Filled 2023-07-23 (×4): qty 100

## 2023-07-23 MED ORDER — POTASSIUM CHLORIDE 20 MEQ PO PACK
20.0000 meq | PACK | ORAL | Status: AC
  Administered 2023-07-23 (×2): 20 meq
  Filled 2023-07-23 (×2): qty 1

## 2023-07-23 MED ORDER — DEXTROSE 50 % IV SOLN: 25.0000 mL | Freq: Once | INTRAVENOUS | Status: AC

## 2023-07-23 MED ORDER — VITAL 1.5 CAL PO LIQD
1000.0000 mL | ORAL | Status: DC
  Administered 2023-07-23: 1000 mL
  Filled 2023-07-23: qty 1000

## 2023-07-23 MED ORDER — DEXTROSE 50 % IV SOLN
INTRAVENOUS | Status: AC
  Administered 2023-07-23: 25 mL via INTRAVENOUS
  Filled 2023-07-23: qty 50

## 2023-07-23 MED ORDER — DEXTROSE 50 % IV SOLN
INTRAVENOUS | Status: AC
  Administered 2023-07-23: 50 mL
  Filled 2023-07-23: qty 50

## 2023-07-23 MED ORDER — SODIUM CHLORIDE 0.9 % IV SOLN: INTRAVENOUS | Status: DC

## 2023-07-23 MED ORDER — DEXTROSE 50 % IV SOLN
25.0000 g | INTRAVENOUS | Status: DC | PRN
  Administered 2023-07-23: 25 g via INTRAVENOUS
  Filled 2023-07-23: qty 50

## 2023-07-23 MED ORDER — DEXTROSE 50 % IV SOLN
25.0000 mL | Freq: Once | INTRAVENOUS | Status: AC
  Administered 2023-07-23: 25 mL via INTRAVENOUS

## 2023-07-23 NOTE — Progress Notes (Signed)
[facility] ADULT ICU REPLACEMENT PROTOCOL   The patient does apply for the [facility] Adult ICU Electrolyte Replacment Protocol based on the criteria listed below:   1.Exclusion criteria: TCTS, ECMO, Dialysis, and Myasthenia Gravis patients 2. Is GFR >/= 30 ml/min? Yes.    Patient's GFR today is >60 3. Is SCr </= 2? Yes.   Patient's SCr is 0.80 mg/dL 4. Did SCr increase >/= 0.5 in 24 hours? No. 5.Pt's weight >40kg  Yes.   6. Abnormal electrolyte(s): K, Mag  7. Electrolytes replaced per protocol 8.  Call MD STAT for K+ </= 2.5, Phos </= 1, or Mag </= 1 Physician:  Thersa Salt  07/23/2023 5:52 AM

## 2023-07-23 NOTE — Procedures (Signed)
Paracentesis Procedure Note  Patricia Parker  [id number]  July 30, 1971  Date:07/02/2023 Time:12:27 PM   Provider Performing: Mechele Collin, MD    Procedure: Paracentesis with imaging guidance (95284)  Indication(s) Ascites  Consent Risks of the procedure as well as the alternatives and risks of each were explained to the patient and/or caregiver.  Consent for the procedure was obtained and is signed in the bedside chart  Anesthesia Topical only with 1% lidocaine    Time Out Verified patient identification, verified procedure, site/side was marked, verified correct patient position, special equipment/implants available, medications/allergies/relevant history reviewed, required imaging and test results available.   Sterile Technique Maximal sterile technique including full sterile barrier drape, hand hygiene, sterile gown, sterile gloves, mask, hair covering, sterile ultrasound probe cover (if used).   Procedure Description Ultrasound used to identify appropriate peritoneal anatomy for placement and overlying skin marked.  Area of drainage cleaned and draped in sterile fashion. Lidocaine was used to anesthetize the skin and subcutaneous tissue.  50 cc's of cloudy fluid appearing fluid was drained. Catheter then removed and bandaid applied to site.   Complications/Tolerance None; patient tolerated the procedure well.   EBL Minimal   Specimen(s) Peritoneal fluid

## 2023-07-23 NOTE — Progress Notes (Addendum)
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   She was recently in the ED at [facility] from 07/17/23-07/18/23 for hyperglycemia with CBG 537. CT A/P 07/17/23 demonstrated "Progressive disease in the liver. New ascites. Stable splenomegaly and left adrenal mass. Portacaval node larger."  She and her husband lives her son. At baseline she is able to perform her own ADLs but instead was more tired and had poor appetite after being discharged from the ED. Compliant with her meds per son but he does not help manage this. He was unaware that her cancer was not curative and did not realize recent images at [facility] demonstrated progression of her cancer.  She presented to [facility] ED on 07/19/2023 for recent lethargy yesterday and then found unresponsive today with CBG 31 by EMS. Amp of dextrose was given and improved to 108 however patient only responded to pain. Intubated for airway protection. Labs significant K 3.4,  BUN/Cr 24/1.20. AP 308. Tbili 7.6 AST 88 ALT 26.  LA 3.8  Pertinent  Medical History  As above  Significant Hospital Events: Including procedures, antibiotic start and stop dates in addition to other pertinent events   7/28 Transferred from [facility] ED and admitted to [facility] 7/29 changed cefepime to CTX started D 10 family agrees to transition to comfort if no improvement in 24 hrs   Interim History / Subjective:  Still minimally responsive   Objective   Blood pressure 117/71, pulse (!) 134,  temperature 99.7 F (37.6 C), temperature source Axillary, resp. rate (!) 23, height 5\' 7"  (1.702 m), weight 92.6 kg, SpO2 95%.    Vent Mode: PRVC FiO2 (%):  [28 %-60 %] 28 % Set Rate:  [16 bmp-20 bmp] 16 bmp Vt Set:  [400 mL-520 mL] 400 mL PEEP:  [4 cmH20-5 cmH20] 5 cmH20 Pressure Support:  [8 cmH20] 8 cmH20 Plateau Pressure:  [16 cmH20] 16 cmH20   Intake/Output Summary (Last 24 hours) at 07/23/2023 1222 Last data filed at 07/23/2023 1045 Gross per 24 hour  Intake 2765.44 ml  Output 700 ml  Net 2065.44 ml   Filed Weights   07/11/2023 1032 07/09/2023 1506 07/23/23 0730  Weight: 108.9 kg 90.3 kg 92.6 kg    Physical Exam: General minimally responsive on vent  HENT NCAT sclera are icteric. Orally intubated Pulm clear. Good volumes of PSV 5000-600  Card rrr Abd soft Ext warm pitting edema brisk CR Neuro opens eyes spont. Not following commands. Moves ext spont.  GU clear yellow   Resolved Hospital Problem list   AKI Lactic acid  Assessment & Plan:   Acute metabolic encephalopathy secondary hypoglycemia, liver failure Refractory hypoglycemia - concerned this is related to her progressive malignancy involving liver Plan Change IVF to D10  Check ammonia level Cont tubefeeds PAD protocol for RASS goal -1  Acute liver failure Elevated LFTs with cholestatic pattern likely from intra/extrahepatic biliary obstruction d/t malignancy Unresectable metastatic cholangiocarcinoma on chemo s/p palliative radiation Progressive liver mets on 7/23 with new ascites Plan Trend LFTs F/u am INR  Intubated for airway protection Plan Cont full vent support VAP bundle  PAD protocol Am CXR RASS goal 0 to -1   Sepsis, Fever - unclear source, working dx SBP Lactate cleared. ' Plan Cont day 2 abx; change to ceftriaxone from cefapime    Anemia and thrombocytopenia 2/2 chemo and malignancy -no evidence of bleeding Plan Transfuse for hgb < 7  Trend CBC   Hypoalbuminemia Severe  protein malnutrition Plan Cont tubefeeds as directed by RD   Best Practice (right click and "Reselect all SmartList Selections" daily)   Diet/type: tubefeeds DVT prophylaxis: prophylactic heparin  GI prophylaxis: H2B Lines: N/A Foley:  N/A Code Status:  full code Last date of multidisciplinary goals of care discussion [ ]  Attempted to call son and father. Unable to leave voicemail    Critical care time: 45 min     Simonne Martinet ACNP-BC [facility] Pulmonary/Critical Care Pager # [phone] OR # [phone] if no answer

## 2023-07-23 NOTE — Progress Notes (Signed)
Initial Nutrition Assessment  INTERVENTION:   -Initiate Vital 1.5 @ 20 ml/hr via OGT, advance by 10 ml every 12 hours to goal rate of 55 ml/hr.  NUTRITION DIAGNOSIS:   Increased nutrient needs related to chronic illness as evidenced by estimated needs.  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 for AMS in setting of refractory hypoglycemia and intubated for airway protection in the ED.  7/29: s/p paracentesis  Patient had been receiving Vital HP via tube feeding protocol. RD received message from Pharmacy that stock is running out of Vital HP so not available to continue. Switched to Vital 1.5 at 20 ml/hr. Goal is 55 ml/hr.   Patient is currently intubated on ventilator support MV: 9 L/min Temp (24hrs), Avg:100.2 F (37.9 C), Min:97.5 F (36.4 C), Max:101.5 F (38.6 C)   Per CCM note, pt may go comfort care if no improvement in 24 hours.   Admission weight: 199 lbs Current weight: 204 lbs  Medications: Colace, Pepcid, Miralax, KLOR-CON, D10 infusion, Fentanyl, IV Mg sulfate, KCl, D50 infusion  Labs reviewed: CBGs: 47-118  NUTRITION - FOCUSED PHYSICAL EXAM:  Patient unavailable at time of visit.  Diet Order:   Diet Order             Diet NPO time specified  Diet effective now                   EDUCATION NEEDS:   Not appropriate for education at this time  Skin:  Skin Assessment: Skin Integrity Issues: Skin Integrity Issues:: DTI DTI: sacrum  Last BM:  PTA  Height:    Ht Readings from Last 1 Encounters:  07/23/23 5\' 7"  (1.702 m)    Weight:   Wt Readings from Last 1 Encounters:  07/23/23 92.6 kg   BMI:  Body mass index is 31.97 kg/m.  Estimated Nutritional Needs:   Kcal:  1850-2050  Protein:  85-100g  Fluid:  2L/day   Tilda Franco, MS, RD, LDN Inpatient Clinical Dietitian Contact information available via Amion

## 2023-07-24 DIAGNOSIS — K7201 Acute and subacute hepatic failure with coma: Secondary | ICD-10-CM | POA: Diagnosis not present

## 2023-07-24 DIAGNOSIS — A419 Sepsis, unspecified organism: Secondary | ICD-10-CM | POA: Diagnosis not present

## 2023-07-24 DIAGNOSIS — C7889 Secondary malignant neoplasm of other digestive organs: Secondary | ICD-10-CM

## 2023-07-24 DIAGNOSIS — E162 Hypoglycemia, unspecified: Secondary | ICD-10-CM | POA: Diagnosis not present

## 2023-07-24 LAB — GLUCOSE, CAPILLARY
Glucose-Capillary: 212 mg/dL — ABNORMAL HIGH (ref 70–99)
Glucose-Capillary: 217 mg/dL — ABNORMAL HIGH (ref 70–99)
Glucose-Capillary: 245 mg/dL — ABNORMAL HIGH (ref 70–99)

## 2023-07-24 MED ORDER — FENTANYL BOLUS VIA INFUSION
100.0000 ug | INTRAVENOUS | Status: DC | PRN
  Administered 2023-07-24 – 2023-07-26 (×12): 100 ug via INTRAVENOUS

## 2023-07-24 MED ORDER — POTASSIUM CHLORIDE 20 MEQ PO PACK
20.0000 meq | PACK | ORAL | Status: AC
  Administered 2023-07-24 (×2): 20 meq
  Filled 2023-07-24 (×2): qty 1

## 2023-07-24 MED ORDER — POLYVINYL ALCOHOL 1.4 % OP SOLN: 1.0000 [drp] | Freq: Four times a day (QID) | OPHTHALMIC | Status: DC | PRN

## 2023-07-24 MED ORDER — ACETAMINOPHEN 325 MG PO TABS: 650.0000 mg | ORAL_TABLET | Freq: Four times a day (QID) | ORAL | Status: DC | PRN

## 2023-07-24 MED ORDER — GLYCOPYRROLATE 0.2 MG/ML IJ SOLN
0.2000 mg | INTRAMUSCULAR | Status: DC | PRN
  Administered 2023-07-24 – 2023-07-26 (×4): 0.2 mg via INTRAVENOUS
  Filled 2023-07-24 (×4): qty 1

## 2023-07-24 MED ORDER — FENTANYL 2500MCG IN NS 250ML (10MCG/ML) PREMIX INFUSION
0.0000 ug/h | INTRAVENOUS | Status: DC
  Administered 2023-07-24: 50 ug/h via INTRAVENOUS
  Administered 2023-07-25: 150 ug/h via INTRAVENOUS
  Administered 2023-07-25: 125 ug/h via INTRAVENOUS
  Administered 2023-07-25: 175 ug/h via INTRAVENOUS
  Administered 2023-07-26: 225 ug/h via INTRAVENOUS
  Filled 2023-07-24 (×2): qty 250

## 2023-07-24 MED ORDER — GLYCOPYRROLATE 1 MG PO TABS: 1.0000 mg | ORAL_TABLET | ORAL | Status: DC | PRN

## 2023-07-24 MED ORDER — SODIUM CHLORIDE 0.9 % IV SOLN: INTRAVENOUS | Status: DC

## 2023-07-24 MED ORDER — ACETAMINOPHEN 650 MG RE SUPP: 650.0000 mg | Freq: Four times a day (QID) | RECTAL | Status: DC | PRN

## 2023-07-24 MED ORDER — GLYCOPYRROLATE 0.2 MG/ML IJ SOLN: 0.2000 mg | INTRAMUSCULAR | Status: DC | PRN

## 2023-07-24 MED ORDER — POTASSIUM CHLORIDE 10 MEQ/100ML IV SOLN
10.0000 meq | INTRAVENOUS | Status: AC
  Administered 2023-07-24 (×4): 10 meq via INTRAVENOUS
  Filled 2023-07-24 (×4): qty 100

## 2023-07-24 NOTE — Progress Notes (Signed)
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   She was recently in the ED at [facility] from 07/17/23-07/18/23 for hyperglycemia with CBG 537. CT A/P 07/17/23 demonstrated "Progressive disease in the liver. New ascites. Stable splenomegaly and left adrenal mass. Portacaval node larger."  She and her husband lives her son. At baseline she is able to perform her own ADLs but instead was more tired and had poor appetite after being discharged from the ED. Compliant with her meds per son but he does not help manage this. He was unaware that her cancer was not curative and did not realize recent images at [facility] demonstrated progression of her cancer.  She presented to [facility] ED on 07/20/2023 for recent lethargy yesterday and then found unresponsive today with CBG 31 by EMS. Amp of dextrose was given and improved to 108 however patient only responded to pain. Intubated for airway protection. Labs significant K 3.4,  BUN/Cr 24/1.20. AP 308. Tbili 7.6 AST 88 ALT 26.  LA 3.8  Pertinent  Medical History  As above  Significant Hospital Events: Including procedures, antibiotic start and stop dates in addition to other pertinent events   7/28 Transferred from [facility] ED and admitted to [facility] 7/29 changed cefepime to CTX started D 10 family agrees to transition to comfort if no improvement in 24 hrs   Interim History / Subjective:  No real response   Objective   Blood pressure (!) 101/49, pulse (!) 116,  temperature 98.6 F (37 C), temperature source Axillary, resp. rate 16, height 5\' 7"  (1.702 m), weight 93.4 kg, SpO2 97%.    Vent Mode: CPAP;PSV FiO2 (%):  [28 %] 28 % Set Rate:  [16 bmp] 16 bmp Vt Set:  [400 mL] 400 mL PEEP:  [5 cmH20] 5 cmH20 Pressure Support:  [5 cmH20] 5 cmH20 Plateau Pressure:  [6 cmH20-18 cmH20] 16 cmH20   Intake/Output Summary (Last 24 hours) at 07/24/2023 1207 Last data filed at 07/24/2023 1137 Gross per 24 hour  Intake 3966.2 ml  Output 945 ml  Net 3021.2 ml   Filed Weights   07/01/2023 1506 07/23/23 0730 07/24/23 0500  Weight: 90.3 kg 92.6 kg 93.4 kg    Physical Exam: General remains unresponsive off sedation  HENT orally intubated  Pulm dec bases. No accessory use on PSV Card rrr Abd soft Ext warm  Neuro grimaces to pain only   Resolved Hospital Problem list   AKI Lactic acid  Assessment & Plan:   Acute metabolic encephalopathy secondary hypoglycemia, liver failure Refractory hypoglycemia - concerned this is related to her progressive malignancy involving liver Plan Cont D10 as we are holding tubefeeds, can dc post extubation  Cont the lactulose for now dc post extubation  Comfort care   Acute liver failure Elevated LFTs with cholestatic pattern likely from intra/extrahepatic biliary obstruction d/t malignancy Unresectable metastatic cholangiocarcinoma on chemo s/p palliative radiation Progressive liver mets on 7/23 with new ascites Plan Supportive care   Intubated for airway protection Plan Cont PSV PRN analgesia VAP bundle  Extubate after I confirm w/ Son Madelaine Bhat   Sepsis, Fever - unclear source, working dx SBP Lactate cleared. ' Plan Day 3 abx. Now on ceftriaxone  Anticipate stopping once I verify GOC w/ son   Hypokalemia Plan Replaced   Anemia and thrombocytopenia 2/2 chemo and malignancy -no evidence of bleeding Plan Minimize labs   Hypoalbuminemia Severe protein malnutrition Plan Hold tubefeeds   Best Practice  (right click and "Reselect all SmartList Selections" daily)   Diet/type: tubefeeds DVT prophylaxis: prophylactic heparin  GI prophylaxis: H2B Lines: N/A Foley:  N/A Code Status:  full code Last date of multidisciplinary goals of care discussion [ ]  Attempted to call son and father. Unable to leave voicemail    Critical care time: 31 min    Simonne Martinet ACNP-BC [facility] Pulmonary/Critical Care Pager # [phone] OR # [phone] if no answer

## 2023-07-24 NOTE — Consult Note (Addendum)
Palliative Care Consultation Note  Ms. Patricia Parker is a 52 year old woman with metastatic cholangiocarcinoma who has widely metastatic disease and was  transferred from [facility] with altered mental status, refractory hypoglycemia in the setting of progressive malignancy, additional acute liver failure with elevated liver function tests.  She has had a fever and unclear source of infection possible SBP with lactic acidosis and sepsis. She has not been able to have any purposeful communication or movement does not follow commands or move her extremities.  Prior to this admission she had articulatd her goals of care to allow for natural death to occur if she got sicker or there were limited treatment options.  A decision was made however to provide short-term intubation to allow her son and other family to arrive to the hospital to be present with her at end-of-life and to provide time needed for family to establish plans and for closure.  I met with patient's father and stepmother as well as her brother at bedside today to provide support in the transition to comfort care.  At this time they are awaiting the arrival of her son to the hospital at which point we will proceed with comfort care and extubation. At this time the patient shows no signs of suffering she is unresponsive and having no nonverbal signs of pain or distress.  She is on minimal sedation presently. Intubated for airway protection. I anticipate a hospital death shortly after extubation.EOL order set and palliative prophylaxis recommended.  Will follow for any additional needs.  Anderson Malta, DO Palliative Medicine  Time:35 min

## 2023-07-24 NOTE — Procedures (Signed)
Extubation Procedure Note  Patient Details:   Name: Patricia Parker DOB: 11-26-1971 MRN: [id number]   Airway Documentation:    Vent end date: 07/24/23 Vent end time: 1630   Evaluation  O2 sats: currently acceptable Complications: No apparent complications Patient did tolerate procedure well. Bilateral Breath Sounds: Clear, Diminished   No  Dairl Ponder Nannette 07/24/2023, 4:34 PM  Place PT on 2 LPM nasal cannula post extubation per order. RN aware 1 way extubation is completed.

## 2023-07-24 NOTE — Progress Notes (Signed)
   07/24/23 1639  Spiritual Encounters  Type of Visit Initial  Care provided to: Pt and family  Conversation partners present during encounter Nurse  Referral source Family  Reason for visit End-of-life  OnCall Visit No  Spiritual Framework  Presenting Themes Values and beliefs;Significant life change;Impactful experiences and emotions  Values/beliefs belief in heaven and the afterlife. Prayer  Community/Connection Family  Patient Stress Factors Health changes  Family Stress Factors Loss  Interventions  Spiritual Care Interventions Made Established relationship of care and support;Compassionate presence;Reflective listening;Normalization of emotions;Narrative/life review;Explored values/beliefs/practices/strengths;Bereavement/grief support;Prayer;Supported grief process;Encouragement  Intervention Outcomes  Outcomes Connection to spiritual care;Awareness of support;Reduced anxiety;Reduced fear;Reduced isolation;Patient family open to resources  Spiritual Care Plan  Spiritual Care Issues Still Outstanding No further spiritual care needs at this time (see row info)   Patient's family requested prayer for the patient to "take a trip to heaven." Patient's son, his wife and their son remained in the room as patient was extubated. Chaplain was asked to pray for patient's son as he had to "make the decisions." Chaplain remained during extubation. Chaplain allowed family to be with the patient and let family know to alert staff if they needed further support.   Arlyce Dice, Chaplain Resident

## 2023-07-24 NOTE — Progress Notes (Signed)
Chaplain met with Patricia Parker, Patricia Parker, and Patricia Parker as well as Patricia Parker Patricia Parker).  They have a plan to remove the breathing support she is receiving now.  They are waiting for her son, Patricia Parker, to arrive before proceeding.  They requested that chaplain pray for them throughout the day.  They want to ask Patricia Parker if he would like chaplain presence later.  If they request or if needs arise, please page [place] at [phone].

## 2023-07-24 NOTE — TOC Initial Note (Signed)
Transition of Care [facility]) - Initial/Assessment Note    Patient Details  Name: Patricia Parker MRN: [id number] Date of Birth: 06-23-1971  Transition of Care [facility]) CM/SW Contact:    Larrie Kass, LCSW Phone Number: 07/24/2023, 2:14 PM  Clinical Narrative:                 Per chart review pt currently Intubated. TOC to follow.   Expected Discharge Plan:  (TBD) Barriers to Discharge: Continued Medical Work up   Patient Goals and CMS Choice            Expected Discharge Plan and Services                                              Prior Living Arrangements/Services   Lives with:: Self, Spouse                   Activities of Daily Living Home Assistive Devices/Equipment: None (UTA) ADL Screening (condition at time of admission) Patient's cognitive ability adequate to safely complete daily activities?: No Is the patient deaf or have difficulty hearing?: No Does the patient have difficulty seeing, even when wearing glasses/contacts?: No Does the patient have difficulty concentrating, remembering, or making decisions?: Yes Patient able to express need for assistance with ADLs?: No Does the patient have difficulty dressing or bathing?: Yes Independently performs ADLs?: No Does the patient have difficulty walking or climbing stairs?: Yes Weakness of Legs: Both Weakness of Arms/Hands: Both  Permission Sought/Granted                  Emotional Assessment              Admission diagnosis:  Respiratory failure [facility]) [J96.90] Patient Active Problem List   Diagnosis Date Noted   Respiratory failure (HCC) 07/11/2023   PCP:  Mickie Kay, PA Pharmacy:   [id number] Lorenza Evangelist, [place] - [address] [place] [address] Phone: [phone] Fax: [phone]  [facility] - [place], [place] - [address] [place] [address] Phone: [phone] Fax:  [phone]     Social Determinants of Health (SDOH) Social History: SDOH Screenings   Food Insecurity: Patient Unable To Answer (07/15/2023)  Recent Concern: Food Insecurity - Food Insecurity Present (07/18/2023)   Received from [facility]  Housing: High Risk (07/24/2023)  Transportation Needs: Patient Unable To Answer (07/02/2023)  Utilities: Patient Unable To Answer (06/29/2023)  Financial Resource Strain: Medium Risk (12/31/2022)   Received from [facility]  Physical Activity: Patient Declined (12/31/2022)   Received from [facility]  Social Connections: Moderately Integrated (12/31/2022)   Received from [facility]  Stress: Stress Concern Present (07/18/2023)   Received from [facility]  Tobacco Use: High Risk (07/05/2023)   SDOH Interventions:     Readmission Risk Interventions     No data to display

## 2023-07-24 NOTE — Progress Notes (Signed)
Family all at bedside Son ready to remove ETT Plan Extubate Full comfort care  Fent gtt as needed.   Simonne Martinet ACNP-BC [facility] Pulmonary/Critical Care Pager # [phone] OR # [phone] if no answer

## 2023-07-24 NOTE — Progress Notes (Signed)
[facility] ADULT ICU REPLACEMENT PROTOCOL   The patient does apply for the [facility] Adult ICU Electrolyte Replacment Protocol based on the criteria listed below:   1.Exclusion criteria: TCTS, ECMO, Dialysis, and Myasthenia Gravis patients 2. Is GFR >/= 30 ml/min? Yes.    Patient's GFR today is >60 3. Is SCr </= 2? Yes.   Patient's SCr is 0.73 mg/dL 4. Did SCr increase >/= 0.5 in 24 hours? No. 5.Pt's weight >40kg  Yes.   6. Abnormal electrolyte(s): potassium 3.2  7. Electrolytes replaced per protocol 8.  Call MD STAT for K+ </= 2.5, Phos </= 1, or Mag </= 1 Physician:  protocol  Melvern Banker 07/24/2023 4:33 AM

## 2023-07-24 NOTE — Inpatient Diabetes Management (Signed)
Inpatient Diabetes Program Recommendations  AACE/ADA: New Consensus Statement on Inpatient Glycemic Control (2015)  Target Ranges:  Prepandial:   less than 140 mg/dL      Peak postprandial:   less than 180 mg/dL (1-2 hours)      Critically ill patients:  140 - 180 mg/dL    Latest Reference Range & Units 07/23/23 01:16 07/23/23 04:47 07/23/23 05:37 07/23/23 07:18 07/23/23 07:40 07/23/23 09:57 07/23/23 11:57 07/23/23 12:25 07/23/23 14:31  Glucose-Capillary 70 - 99 mg/dL 78 47 (L) 61 (L) 62 (L) 118 (H) 87 69 (L) 150 (H) 99  (L): Data is abnormally low (H): Data is abnormally high  Latest Reference Range & Units 07/23/23 15:55 07/23/23 18:04 07/23/23 19:46 07/23/23 23:41 07/24/23 03:48 07/24/23 07:33  Glucose-Capillary 70 - 99 mg/dL 92 97 161 (H) 096 (H) 045 (H) 217 (H)  (H): Data is abnormally high     Home DM Meds: Humulin 70/30 Insulin 50 units BID       Amaryl 4 mg BID    Current Orders: Checking CBGs Q4 hours    MD- Note CBGs slowly rising into the low 200s this AM  Please consider stopping the D10% IVF  If pt eventually needs Novolog SSI back, please start with the 0-6 unit Very Sensitive scale Q4 hours    --Will follow patient during hospitalization--  Ambrose Finland RN, MSN, CDCES Diabetes Coordinator Inpatient Glycemic Control Team Team Pager: [phone] (8a-5p)

## 2023-07-25 DIAGNOSIS — E162 Hypoglycemia, unspecified: Secondary | ICD-10-CM | POA: Diagnosis not present

## 2023-07-25 MED FILL — Midazolam HCl Inj 5 MG/5ML (Base Equivalent): INTRAMUSCULAR | Qty: 1 | Status: AC

## 2023-07-25 MED FILL — Fentanyl Citrate Preservative Free (PF) Inj 100 MCG/2ML: INTRAMUSCULAR | Qty: 1 | Status: AC

## 2023-07-25 NOTE — Plan of Care (Signed)
  Problem: Education: Goal: Knowledge of General Education information will improve Description: Including pain rating scale, medication(s)/side effects and non-pharmacologic comfort measures Outcome: Not Met (add Reason)   Problem: Health Behavior/Discharge Planning: Goal: Ability to manage health-related needs will improve Outcome: Not Met (add Reason)   Problem: Clinical Measurements: Goal: Ability to maintain clinical measurements within normal limits will improve Outcome: Not Met (add Reason) Goal: Will remain free from infection Outcome: Not Met (add Reason) Goal: Diagnostic test results will improve Outcome: Not Met (add Reason) Goal: Respiratory complications will improve Outcome: Not Met (add Reason) Goal: Cardiovascular complication will be avoided Outcome: Not Met (add Reason)   Problem: Activity: Goal: Risk for activity intolerance will decrease Outcome: Not Met (add Reason)   Problem: Nutrition: Goal: Adequate nutrition will be maintained Outcome: Not Met (add Reason)   Problem: Coping: Goal: Level of anxiety will decrease Outcome: Not Met (add Reason)   Problem: Elimination: Goal: Will not experience complications related to bowel motility Outcome: Not Met (add Reason) Goal: Will not experience complications related to urinary retention Outcome: Not Met (add Reason)   Problem: Pain Managment: Goal: General experience of comfort will improve Outcome: Not Met (add Reason)   Problem: Safety: Goal: Ability to remain free from injury will improve Outcome: Not Met (add Reason)   Problem: Skin Integrity: Goal: Risk for impaired skin integrity will decrease Outcome: Not Met (add Reason)   Problem: Activity: Goal: Ability to tolerate increased activity will improve Outcome: Not Met (add Reason)   Problem: Respiratory: Goal: Ability to maintain a clear airway and adequate ventilation will improve Outcome: Not Met (add Reason)   Problem: Role  Relationship: Goal: Method of communication will improve Outcome: Not Met (add Reason)   Problem: Fluid Volume: Goal: Hemodynamic stability will improve Outcome: Not Met (add Reason)   Problem: Clinical Measurements: Goal: Diagnostic test results will improve Outcome: Not Met (add Reason) Goal: Signs and symptoms of infection will decrease Outcome: Not Met (add Reason)   Problem: Respiratory: Goal: Ability to maintain adequate ventilation will improve Outcome: Not Met (add Reason)

## 2023-07-25 NOTE — Progress Notes (Addendum)
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   She was recently in the ED at [facility] from 07/17/23-07/18/23 for hyperglycemia with CBG 537. CT A/P 07/17/23 demonstrated "Progressive disease in the liver. New ascites. Stable splenomegaly and left adrenal mass. Portacaval node larger."  She and her husband lives her son. At baseline she is able to perform her own ADLs but instead was more tired and had poor appetite after being discharged from the ED. Compliant with her meds per son but he does not help manage this. He was unaware that her cancer was not curative and did not realize recent images at [facility] demonstrated progression of her cancer.  She presented to [facility] ED on 07/07/2023 for recent lethargy yesterday and then found unresponsive today with CBG 31 by EMS. Amp of dextrose was given and improved to 108 however patient only responded to pain. Intubated for airway protection. Labs significant K 3.4,  BUN/Cr 24/1.20. AP 308. Tbili 7.6 AST 88 ALT 26.  LA 3.8  Pertinent  Medical History  As above  Significant Hospital Events: Including procedures, antibiotic start and stop dates in addition to other pertinent events   7/28 Transferred from [facility] ED and admitted to [facility] 7/29 changed cefepime to CTX started D 10 family agrees to transition to comfort if no improvement in 24 hrs  7/30 Transitioned to comfort care  Interim History / Subjective:  Family at bedside. Comfort care  Objective    Blood pressure (!) 112/54, pulse (!) 121, temperature 98.5 F (36.9 C), temperature source Axillary, resp. rate 18, height 5\' 7"  (1.702 m), weight 93.4 kg, SpO2 93%.    Vent Mode: PRVC FiO2 (%):  [28 %] 28 % Set Rate:  [16 bmp] 16 bmp Vt Set:  [400 mL] 400 mL PEEP:  [5 cmH20] 5 cmH20 Plateau Pressure:  [16 cmH20-17 cmH20] 16 cmH20   Intake/Output Summary (Last 24 hours) at 07/25/2023 0956 Last data filed at 07/25/2023 0800 Gross per 24 hour  Intake 1399.99 ml  Output 160 ml  Net 1239.99 ml   Filed Weights   07/21/2023 1506 07/23/23 0730 07/24/23 0500  Weight: 90.3 kg 92.6 kg 93.4 kg    Physical Exam: General: Unresponsive, appears comfortable, jaundiced Respiratory: No respiratory distress Cardiovascular: Tachycardic and regular on tele Neuro: [facility] Problem list   AKI Lactic acid  Assessment & Plan:   Acute metabolic encephalopathy secondary hypoglycemia, liver failure Refractory hypoglycemia - concerned this is related to her progressive malignancy involving liver Acute liver failure Elevated LFTs with cholestatic pattern likely from intra/extrahepatic biliary obstruction d/t malignancy Unresectable metastatic cholangiocarcinoma on chemo s/p palliative radiation Progressive liver mets on 7/23 with new ascites Sepsis, Fever - unclear source, working dx SBP Hypokalemia Anemia and thrombocytopenia 2/2 chemo and malignancy Hypoalbuminemia Severe protein malnutrition  Anticipate in-hospital death Comfort care order set Will transfer to [facility]  Best Practice (right click and "Reselect all SmartList Selections" daily)   Code Status:  DNR Last date of multidisciplinary goals of care  discussion [ ]  Attempted to call son and father. Unable to leave voicemail  Critical care time:     Care Time: 25 min  Mechele Collin, M.D. [facility] Pulmonary/Critical Care Medicine 07/25/2023 9:56 AM   Please see Amion for pager number to reach on-call Pulmonary  and Critical Care Team.

## 2023-07-25 NOTE — Plan of Care (Signed)
  Problem: Education: Goal: Knowledge of General Education information will improve Description: Including pain rating scale, medication(s)/side effects and non-pharmacologic comfort measures Outcome: Not Progressing   Problem: Health Behavior/Discharge Planning: Goal: Ability to manage health-related needs will improve Outcome: Not Progressing   

## 2023-07-26 DEATH — deceased

## 2023-08-26 NOTE — Care Management Important Message (Signed)
Important Message  Patient Details No IM given due to Comfort Care. Name: Patricia Parker MRN: [id number] Date of Birth: 07-28-1971   Medicare Important Message Given:  No     Caren Macadam 08/15/2023, 10:36 AM

## 2023-08-26 NOTE — Death Summary Note (Signed)
DEATH SUMMARY   Patient Details  Name: Patricia Parker MRN: [id number] DOB: 1971-03-17  Admission/Discharge Information   Admit Date:  2023/08/02  Date of Death: Date of Death: 2023/08/06  Time of Death: Time of Death: 0520  Length of Stay: 4  Referring Physician: Mickie Kay, PA   Reason(s) for Hospitalization  Hypoglycemia  Diagnoses  Preliminary cause of death:  Secondary Diagnoses (including complications and co-morbidities):  Principal Problem:   Respiratory failure [facility])   Brief Hospital Course (including significant findings, care, treatment, and services provided and events leading to death)  SHAQUESHA KARL is a 52 year old female former smoker (8 pack-years) with 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    She was recently in the ED at [facility] from 07/17/23-07/18/23 for hyperglycemia with CBG 537. CT A/P 07/17/23 demonstrated "Progressive disease in the liver. New ascites. Stable splenomegaly and left adrenal mass. Portacaval node larger."   She and her husband lives her son. At baseline she is able to perform her own ADLs but instead was more tired and had poor appetite after being discharged from the ED. Compliant with her meds per son but he does not help manage this. He was unaware that her cancer was not curative and did not realize recent images at [facility] demonstrated progression of her cancer.   She presented to [facility] ED on 02-Aug-2023 for recent lethargy yesterday and then found unresponsive today with CBG 31 by EMS. Amp of dextrose was given and improved to 108 however patient only responded to pain. Intubated for airway protection. Labs significant K 3.4,  BUN/Cr 24/1.20. AP 308. Tbili 7.6 AST 88 ALT 26.  LA 3.8  She was transferred to  [facility] requiring dextrose gtt and ventilator support. Treated with antibiotics for spontaneous bacterial peritonitis (cultures neg but WBC >9000 with neutrophilic predominance). Despite correction of metabolic abnormalities, she remained unresponsive. Family, understanding her poor prognosis with metastatic cancer, elected to withdraw care. Patient passed on 08/06/2023 at 0520.  Pertinent Labs and Studies  Significant Diagnostic Studies DG CHEST PORT 1 VIEW  Result Date: 08/02/2023 CLINICAL DATA:  Intubation. EXAM: PORTABLE CHEST 1 VIEW COMPARISON:  Same day. FINDINGS: Endotracheal and nasogastric tubes are in grossly good position. Right-sided Port-A-Cath is unchanged. Right lung is clear. Mild left basilar atelectasis or infiltrate is noted with associated effusion. Bony thorax is unremarkable. IMPRESSION: Stable support apparatus.  Left basilar opacity as described above. Electronically Signed   By: Lupita Raider M.D.   On: 2023-08-02 15:18   DG Chest 1 View  Result Date: 08/02/2023 CLINICAL DATA:  52 year old female found unresponsive. Hypoglycemia. Intubated. EXAM: CHEST  1 VIEW COMPARISON:  Chest radiographs 10/31/2017. FINDINGS: Portable AP supine views at 1015 hours. Rotated to the left on both views. Endotracheal tube tip is at the level the clavicles. Enteric tube terminates in the stomach, side hole the level of the gastric body. Superimposed right chest Port-A-Cath. Lower lung volumes. Allowing for portable technique and rotation mediastinal contours appear stable, lungs appear clear. No pneumothorax or pleural effusion. Paucity of bowel gas. No acute osseous abnormality identified. IMPRESSION: 1. Endotracheal tube tip at the level of the clavicles. Satisfactory enteric tube placement into the stomach. 2. Rotated view with lower lung volumes. No acute cardiopulmonary abnormality identified. Electronically  Signed   By: Odessa Fleming M.D.   On: 2023/08/15 10:48   CT Head Wo  Contrast  Result Date: 08-15-2023 CLINICAL DATA:  52 year old female found unresponsive at home. Hypoglycemia. Possible sepsis, syncope. EXAM: CT HEAD WITHOUT CONTRAST TECHNIQUE: Contiguous axial images were obtained from the base of the skull through the vertex without intravenous contrast. RADIATION DOSE REDUCTION: This exam was performed according to the departmental dose-optimization program which includes automated exposure control, adjustment of the mA and/or kV according to patient size and/or use of iterative reconstruction technique. COMPARISON:  None Available. FINDINGS: Brain: Normal cerebral volume. No midline shift, ventriculomegaly, mass effect, evidence of mass lesion, intracranial hemorrhage or evidence of cortically based acute infarction. Gray-white matter differentiation is within normal limits throughout the brain. Vascular: No hyperdense vessel or unexpected calcification. Skull: Negative. Sinuses/Orbits: Visualized paranasal sinuses and mastoids are clear. Other: Intubated on the scout view. Visualized orbits and scalp soft tissues are within normal limits. IMPRESSION: Normal noncontrast Head CT. Electronically Signed   By: Odessa Fleming M.D.   On: 08-15-2023 10:46    Microbiology Recent Results (from the past 240 hour(s))  Blood Culture (routine x 2)     Status: None (Preliminary result)   Collection Time: 08-15-23 10:02 AM   Specimen: Right Antecubital; Blood  Result Value Ref Range Status   Specimen Description   Final    RIGHT ANTECUBITAL BOTTLES DRAWN AEROBIC AND ANAEROBIC   Special Requests   Final    Blood Culture results may not be optimal due to an excessive volume of blood received in culture bottles   Culture   Final    NO GROWTH 4 DAYS Performed at [facility], [address], [place], [place] [address]    Report Status PENDING  Incomplete  Blood Culture (routine x 2)     Status: None (Preliminary result)   Collection Time: August 15, 2023 11:01 AM   Specimen: BLOOD LEFT  HAND  Result Value Ref Range Status   Specimen Description BLOOD LEFT HAND AEROBIC BOTTLE ONLY  Final   Special Requests Blood Culture adequate volume  Final   Culture   Final    NO GROWTH 4 DAYS Performed at [facility], [address], [place], [place] [address]    Report Status PENDING  Incomplete  Resp panel by RT-PCR (RSV, Flu A&B, Covid) Anterior Nasal Swab     Status: None   Collection Time: 15-Aug-2023 12:23 PM   Specimen: Anterior Nasal Swab  Result Value Ref Range Status   SARS Coronavirus 2 by RT PCR NEGATIVE NEGATIVE Final    Comment: (NOTE) SARS-CoV-2 target nucleic acids are NOT DETECTED.  The SARS-CoV-2 RNA is generally detectable in upper respiratory specimens during the acute phase of infection. The lowest concentration of SARS-CoV-2 viral copies this assay can detect is 138 copies/mL. A negative result does not preclude SARS-Cov-2 infection and should not be used as the sole basis for treatment or other patient management decisions. A negative result may occur with  improper specimen collection/handling, submission of specimen other than nasopharyngeal swab, presence of viral mutation(s) within the areas targeted by this assay, and inadequate number of viral copies(<138 copies/mL). A negative result must be combined with clinical observations, patient history, and epidemiological information. The expected result is Negative.  Fact Sheet for Patients:  BloggerCourse.com  Fact Sheet for Healthcare Providers:  SeriousBroker.it  This test is no t yet approved or cleared by the [place] FDA and  has been authorized for detection and/or  diagnosis of SARS-CoV-2 by FDA under an Emergency Use Authorization (EUA). This EUA will remain  in effect (meaning this test can be used) for the duration of the COVID-19 declaration under Section 564(b)(1) of the Act, 21 U.S.C.section 360bbb-3(b)(1), unless the authorization is  terminated  or revoked sooner.       Influenza A by PCR NEGATIVE NEGATIVE Final   Influenza B by PCR NEGATIVE NEGATIVE Final    Comment: (NOTE) The Xpert Xpress SARS-CoV-2/FLU/RSV plus assay is intended as an aid in the diagnosis of influenza from Nasopharyngeal swab specimens and should not be used as a sole basis for treatment. Nasal washings and aspirates are unacceptable for Xpert Xpress SARS-CoV-2/FLU/RSV testing.  Fact Sheet for Patients: BloggerCourse.com  Fact Sheet for Healthcare Providers: SeriousBroker.it  This test is not yet approved or cleared by the [place] FDA and has been authorized for detection and/or diagnosis of SARS-CoV-2 by FDA under an Emergency Use Authorization (EUA). This EUA will remain in effect (meaning this test can be used) for the duration of the COVID-19 declaration under Section 564(b)(1) of the Act, 21 U.S.C. section 360bbb-3(b)(1), unless the authorization is terminated or revoked.     Resp Syncytial Virus by PCR NEGATIVE NEGATIVE Final    Comment: (NOTE) Fact Sheet for Patients: BloggerCourse.com  Fact Sheet for Healthcare Providers: SeriousBroker.it  This test is not yet approved or cleared by the [place] FDA and has been authorized for detection and/or diagnosis of SARS-CoV-2 by FDA under an Emergency Use Authorization (EUA). This EUA will remain in effect (meaning this test can be used) for the duration of the COVID-19 declaration under Section 564(b)(1) of the Act, 21 U.S.C. section 360bbb-3(b)(1), unless the authorization is terminated or revoked.  Performed at [facility], [address], [place], [place] [address]   MRSA Next Gen by PCR, Nasal     Status: None   Collection Time: 2023-08-04  3:00 PM   Specimen: Nasal Mucosa; Nasal Swab  Result Value Ref Range Status   MRSA by PCR Next Gen NOT DETECTED NOT DETECTED  Final    Comment: (NOTE) The GeneXpert MRSA Assay (FDA approved for NASAL specimens only), is one component of a comprehensive MRSA colonization surveillance program. It is not intended to diagnose MRSA infection nor to guide or monitor treatment for MRSA infections. Test performance is not FDA approved in patients less than 64 years old. Performed at [facility], [address], [place], [place] [address]   Body fluid culture w Gram Stain     Status: None   Collection Time: 08-04-2023  5:36 PM   Specimen: Peritoneal Washings; Peritoneal Fluid  Result Value Ref Range Status   Specimen Description   Final    PERITONEAL FLUID Performed at [facility], [address], [place], [place] [address]    Special Requests   Final    NONE Performed at [facility], [address], [place], [place] [address]    Gram Stain   Final    RARE WBC PRESENT, PREDOMINANTLY PMN NO ORGANISMS SEEN    Culture   Final    NO GROWTH 3 DAYS Performed at [facility], [address] [place], [place] [address]    Report Status 08/01/2023 FINAL  Final    Lab Basic Metabolic Panel: Recent Labs  Lab 08/04/2023 1002 04-Aug-2023 1016 08/04/2023 1555 August 04, 2023 1731 07/23/23 0438 07/23/23 1638 07/24/23 0318  NA 136 141  --   --  138  --  139  K 3.3* 3.4*  --   --  3.0*  --  3.2*  CL 103 104  --   --  111  --  111  CO2 24  --   --   --  19*  --  19*  GLUCOSE 82 76  --   --  55*  --  221*  BUN 27* 24*  --   --  25*  --  21*  CREATININE 1.12* 1.20*  --   --  0.80  --  0.73  CALCIUM 8.2*  --   --   --  7.8*  --  8.3*  MG  --   --  1.8 1.7 1.7 1.9  --   PHOS  --   --  4.0 4.0 3.5 3.1  --    Liver Function Tests: Recent Labs  Lab 07/21/2023 1002 07/15/2023 1757 07/23/23 0438 07/24/23 0318  AST 88*  --  82* 68*  ALT 26  --  24 21  ALKPHOS 308*  --  239* 271*  BILITOT 7.6*  --  6.3* 7.7*  PROT 5.7* 5.1* 5.0* 5.0*  ALBUMIN 1.7* 1.5* <1.5* <1.5*   No results  for input(s): "LIPASE", "AMYLASE" in the last 168 hours. Recent Labs  Lab 07/14/2023 1002 07/23/23 1242  AMMONIA 25 45*   CBC: Recent Labs  Lab 06/28/2023 1002 07/12/2023 1016 07/23/23 0438 07/24/23 0318  WBC 7.9  --  7.8 11.8*  NEUTROABS 6.7  --   --   --   HGB 11.3* 11.6* 10.1* 9.9*  HCT 35.1* 34.0* 32.6* 31.1*  MCV 97.8  --  100.9* 98.7  PLT 96*  --  86* 74*   Cardiac Enzymes: No results for input(s): "CKTOTAL", "CKMB", "CKMBINDEX", "TROPONINI" in the last 168 hours. Sepsis Labs: Recent Labs  Lab 06/30/2023 1002 07/06/2023 1555 07/23/23 0438 07/24/23 0318  WBC 7.9  --  7.8 11.8*  LATICACIDVEN 3.8* 3.9* 1.8  --        Ellington Cornia Mechele Collin 08/24/2023, 1:36 PM

## 2023-08-26 DEATH — deceased
